# Patient Record
Sex: Male | Born: 1975 | Race: Black or African American | Hispanic: No | Marital: Single | Smoking: Never smoker
Health system: Southern US, Community
[De-identification: ages and names within clinical notes are randomized; demographics above are authoritative.]

## PROBLEM LIST (undated history)

## (undated) DIAGNOSIS — I10 Essential (primary) hypertension: Secondary | ICD-10-CM

## (undated) DIAGNOSIS — N200 Calculus of kidney: Secondary | ICD-10-CM

---

## 1999-11-03 ENCOUNTER — Emergency Department (HOSPITAL_COMMUNITY): Admission: EM | Admit: 1999-11-03 | Discharge: 1999-11-03 | Payer: Self-pay | Admitting: Emergency Medicine

## 2000-05-31 ENCOUNTER — Emergency Department (HOSPITAL_COMMUNITY): Admission: EM | Admit: 2000-05-31 | Discharge: 2000-05-31 | Payer: Self-pay | Admitting: Emergency Medicine

## 2000-05-31 ENCOUNTER — Encounter: Payer: Self-pay | Admitting: Emergency Medicine

## 2000-09-08 ENCOUNTER — Emergency Department (HOSPITAL_COMMUNITY): Admission: EM | Admit: 2000-09-08 | Discharge: 2000-09-09 | Payer: Self-pay | Admitting: Emergency Medicine

## 2003-02-01 ENCOUNTER — Emergency Department (HOSPITAL_COMMUNITY): Admission: EM | Admit: 2003-02-01 | Discharge: 2003-02-01 | Payer: Self-pay | Admitting: Emergency Medicine

## 2003-05-03 ENCOUNTER — Emergency Department (HOSPITAL_COMMUNITY): Admission: EM | Admit: 2003-05-03 | Discharge: 2003-05-04 | Payer: Self-pay | Admitting: Emergency Medicine

## 2006-02-03 ENCOUNTER — Emergency Department (HOSPITAL_COMMUNITY): Admission: EM | Admit: 2006-02-03 | Discharge: 2006-02-03 | Payer: Self-pay | Admitting: Family Medicine

## 2006-12-21 ENCOUNTER — Emergency Department (HOSPITAL_COMMUNITY): Admission: EM | Admit: 2006-12-21 | Discharge: 2006-12-21 | Payer: Self-pay | Admitting: Family Medicine

## 2009-02-08 ENCOUNTER — Emergency Department (HOSPITAL_COMMUNITY): Admission: EM | Admit: 2009-02-08 | Discharge: 2009-02-08 | Payer: Self-pay | Admitting: Emergency Medicine

## 2011-01-29 LAB — DIFFERENTIAL
Basophils Absolute: 0 10*3/uL (ref 0.0–0.1)
Basophils Relative: 0 % (ref 0–1)
Eosinophils Absolute: 0 10*3/uL (ref 0.0–0.7)
Eosinophils Relative: 1 % (ref 0–5)
Lymphocytes Relative: 4 % — ABNORMAL LOW (ref 12–46)
Lymphs Abs: 0.3 10*3/uL — ABNORMAL LOW (ref 0.7–4.0)
Monocytes Absolute: 0.3 10*3/uL (ref 0.1–1.0)
Monocytes Relative: 3 % (ref 3–12)
Neutro Abs: 6.9 10*3/uL (ref 1.7–7.7)
Neutrophils Relative %: 92 % — ABNORMAL HIGH (ref 43–77)

## 2011-01-29 LAB — CBC
HCT: 48.4 % (ref 39.0–52.0)
Hemoglobin: 16.5 g/dL (ref 13.0–17.0)
MCHC: 34 g/dL (ref 30.0–36.0)
MCV: 81.9 fL (ref 78.0–100.0)
Platelets: 241 10*3/uL (ref 150–400)
RBC: 5.91 MIL/uL — ABNORMAL HIGH (ref 4.22–5.81)
RDW: 13 % (ref 11.5–15.5)
WBC: 7.5 10*3/uL (ref 4.0–10.5)

## 2011-01-29 LAB — URINE MICROSCOPIC-ADD ON

## 2011-01-29 LAB — URINALYSIS, ROUTINE W REFLEX MICROSCOPIC
Bilirubin Urine: NEGATIVE
Glucose, UA: NEGATIVE mg/dL
Hgb urine dipstick: NEGATIVE
Ketones, ur: 40 mg/dL — AB
Leukocytes, UA: NEGATIVE
Nitrite: NEGATIVE
Protein, ur: 30 mg/dL — AB
Specific Gravity, Urine: 1.029 (ref 1.005–1.030)
Urobilinogen, UA: 0.2 mg/dL (ref 0.0–1.0)
pH: 6.5 (ref 5.0–8.0)

## 2011-01-29 LAB — POCT I-STAT, CHEM 8
Calcium, Ion: 1.1 mmol/L — ABNORMAL LOW (ref 1.12–1.32)
HCT: 52 % (ref 39.0–52.0)
Hemoglobin: 17.7 g/dL — ABNORMAL HIGH (ref 13.0–17.0)
TCO2: 23 mmol/L (ref 0–100)

## 2013-04-07 ENCOUNTER — Encounter (HOSPITAL_COMMUNITY): Payer: Self-pay | Admitting: Emergency Medicine

## 2013-04-07 ENCOUNTER — Emergency Department (INDEPENDENT_AMBULATORY_CARE_PROVIDER_SITE_OTHER)
Admission: EM | Admit: 2013-04-07 | Discharge: 2013-04-07 | Disposition: A | Discharge status: Home / Self Care | Payer: Self-pay | Source: Home / Self Care | Attending: Family Medicine | Admitting: Family Medicine

## 2013-04-07 DIAGNOSIS — L259 Unspecified contact dermatitis, unspecified cause: Secondary | ICD-10-CM

## 2013-04-07 DIAGNOSIS — L309 Dermatitis, unspecified: Secondary | ICD-10-CM

## 2013-04-07 MED ORDER — CEPHALEXIN 500 MG PO CAPS: 500.0000 mg | ORAL_CAPSULE | Freq: Two times a day (BID) | ORAL | Status: DC

## 2013-04-07 MED ORDER — TRIAMCINOLONE ACETONIDE 0.5 % EX OINT: TOPICAL_OINTMENT | Freq: Two times a day (BID) | CUTANEOUS | Status: DC

## 2013-04-07 MED ORDER — HYDROXYZINE HCL 25 MG PO TABS: 25.0000 mg | ORAL_TABLET | Freq: Four times a day (QID) | ORAL | Status: DC

## 2013-04-07 NOTE — ED Notes (Signed)
Pt c/o spider bite on left forearm x 4 days. Started as a small bite mark and began to scab over. Now is red and inflamed with a rash around site. Used Bencinite clay and aloe with gauze and aloe around site. Then rash appeared yesterday. Patient is alert and oriented.

## 2013-04-07 NOTE — ED Provider Notes (Signed)
History     CSN: [id number]  Arrival date & time 04/07/13  1513   First MD Initiated Contact with Patient 04/07/13 1536      Chief Complaint  Patient presents with  . Insect Bite    (Consider location/radiation/quality/duration/timing/severity/associated sxs/prior treatment) HPI Comments: 37 year old male nondiabetic. Here complaining of a pruriginous rash in his right forearm first noted about 4 days. Patient thinks that he may have had a spider bite. He did not see an insect. He noted a small blister initially that has started to scab now. There is been several other blisters around worsen in the last 2 days. He denies any known exposure. State he was working in the yard wearing gloves about 2 weeks ago. He noted a small purulent drainage from a small area one day ago. Denies fever or chills.   History reviewed. No pertinent past medical history.  History reviewed. No pertinent past surgical history.  No family history on file.  History  Substance Use Topics  . Smoking status: Never Smoker   . Smokeless tobacco: Not on file  . Alcohol Use: No      Review of Systems  Constitutional: Negative for fever and chills.  HENT: Negative for sore throat and trouble swallowing.   Respiratory: Negative for shortness of breath.   Skin: Positive for rash.  Neurological: Negative for dizziness and headaches.    Allergies  Review of patient's allergies indicates no known allergies.  Home Medications   Current Outpatient Rx  Name  Route  Sig  Dispense  Refill  . cephALEXin (KEFLEX) 500 MG capsule   Oral   Take 1 capsule (500 mg total) by mouth 2 (two) times daily.   14 capsule   0   . hydrOXYzine (ATARAX/VISTARIL) 25 MG tablet   Oral   Take 1 tablet (25 mg total) by mouth every 6 (six) hours.   12 tablet   0   . triamcinolone ointment (KENALOG) 0.5 %   Topical   Apply topically 2 (two) times daily.   30 g   0     BP 142/89  Pulse 61  Resp 16  SpO2  98%  Physical Exam  Nursing note and vitals reviewed. Constitutional: He is oriented to person, place, and time. He appears well-developed and well-nourished. No distress.  HENT:  Mouth/Throat: Oropharynx is clear and moist. No oropharyngeal exudate.  Cardiovascular: Normal heart sounds.   Pulmonary/Chest: Breath sounds normal.  Lymphadenopathy:    He has no cervical adenopathy.  Neurological: He is alert and oriented to person, place, and time.  Skin: He is not diaphoretic.  Left fore arm: There is a vesicular rash located in volar surface about 1.5 inch above the wrist. There is mild base erythema and few cloudy filled vesicles with peeling in the center. No significant cellulitis surrounding. No striking erythema.    ED Course  Procedures (including critical care time)  Labs Reviewed - No data to display No results found.   1. Dermatitis       MDM  Impress mildly infected contact dermatitis. Prescribed cephalexin, hydroxyzine and triamcinolone ointment. Supportive care and red flags that should prompt his return to medical attention discussed with patient and provided in writing.        Sharin Grave, MD 04/09/13 1546

## 2013-11-29 ENCOUNTER — Encounter (HOSPITAL_COMMUNITY): Payer: Self-pay | Admitting: Emergency Medicine

## 2013-11-29 ENCOUNTER — Emergency Department (HOSPITAL_COMMUNITY)
Admission: EM | Admit: 2013-11-29 | Discharge: 2013-11-29 | Disposition: A | Discharge status: Home / Self Care | Payer: No Typology Code available for payment source | Attending: Emergency Medicine | Admitting: Emergency Medicine

## 2013-11-29 DIAGNOSIS — S336XXA Sprain of sacroiliac joint, initial encounter: Secondary | ICD-10-CM | POA: Insufficient documentation

## 2013-11-29 DIAGNOSIS — S39012A Strain of muscle, fascia and tendon of lower back, initial encounter: Secondary | ICD-10-CM

## 2013-11-29 DIAGNOSIS — Y929 Unspecified place or not applicable: Secondary | ICD-10-CM | POA: Insufficient documentation

## 2013-11-29 DIAGNOSIS — X58XXXA Exposure to other specified factors, initial encounter: Secondary | ICD-10-CM | POA: Insufficient documentation

## 2013-11-29 DIAGNOSIS — Z87442 Personal history of urinary calculi: Secondary | ICD-10-CM | POA: Insufficient documentation

## 2013-11-29 DIAGNOSIS — Y939 Activity, unspecified: Secondary | ICD-10-CM | POA: Insufficient documentation

## 2013-11-29 HISTORY — DX: Calculus of kidney: N20.0

## 2013-11-29 LAB — URINALYSIS, ROUTINE W REFLEX MICROSCOPIC
BILIRUBIN URINE: NEGATIVE
Glucose, UA: NEGATIVE mg/dL
HGB URINE DIPSTICK: NEGATIVE
Ketones, ur: NEGATIVE mg/dL
Leukocytes, UA: NEGATIVE
Nitrite: NEGATIVE
PH: 6 (ref 5.0–8.0)
Protein, ur: NEGATIVE mg/dL
SPECIFIC GRAVITY, URINE: 1.022 (ref 1.005–1.030)
UROBILINOGEN UA: 0.2 mg/dL (ref 0.0–1.0)

## 2013-11-29 MED ORDER — OXYCODONE-ACETAMINOPHEN 5-325 MG PO TABS
2.0000 | ORAL_TABLET | Freq: Once | ORAL | Status: DC
Start: 2013-11-29 — End: 2013-11-29

## 2013-11-29 MED ORDER — METHOCARBAMOL 500 MG PO TABS
500.0000 mg | ORAL_TABLET | Freq: Two times a day (BID) | ORAL | Status: DC
Start: 2013-11-29 — End: 2016-07-02

## 2013-11-29 MED ORDER — DIAZEPAM 5 MG PO TABS
5.0000 mg | ORAL_TABLET | Freq: Once | ORAL | Status: AC
  Administered 2013-11-29: 5 mg via ORAL
  Filled 2013-11-29: qty 1

## 2013-11-29 MED ORDER — MELOXICAM 7.5 MG PO TABS: 15.0000 mg | ORAL_TABLET | Freq: Every day | ORAL | Status: DC

## 2013-11-29 MED ORDER — KETOROLAC TROMETHAMINE 60 MG/2ML IM SOLN
60.0000 mg | Freq: Once | INTRAMUSCULAR | Status: DC
  Filled 2013-11-29: qty 2

## 2013-11-29 NOTE — ED Provider Notes (Signed)
CSN: [id number]     Arrival date & time 11/29/13  2044 History  This chart was scribed for non-physician practitioner, Antony Madura, PA-C,working with Ethelda Chick, MD, by Karle Plumber, ED Scribe.  This patient was seen in room WTR9/WTR9 and the patient's care was started at 9:43 PM.  Chief Complaint  Patient presents with  . Back Pain   The history is provided by the patient. No language interpreter was used.   HPI Comments:  Nathaniel Austin is a 38 y.o. male with a h/o kidney stones, who presents to the Emergency Department complaining of worsening severe aching lower left-sided back pain without radiation that started yesterday. He states this pain is somewhat similar to the pain he experienced with his kidney stone, but the pain does not travel to the groin area like it did before. He reports the pain feels like a pulled muscle. Pt states he laid down for a nap about 5 hours ago and upon waking he had a hard time getting out of the bed. Pt states that movement makes the pain worse. Pt denies taking anything for pain. He denies fever, nausea, vomiting, diarrhea, bowel or bladder incontinence, inability to walk, weakness, dysuria, hematuria, or numbness of the groin.  Past Medical History  Diagnosis Date  . Kidney stone    History reviewed. No pertinent past surgical history. No family history on file. History  Substance Use Topics  . Smoking status: Never Smoker   . Smokeless tobacco: Not on file  . Alcohol Use: No    Review of Systems  Constitutional: Negative for fever.  Gastrointestinal: Negative for nausea, vomiting and diarrhea.  Musculoskeletal: Positive for back pain.  Neurological: Negative for weakness and numbness.  All other systems reviewed and are negative.    Allergies  Review of patient's allergies indicates no known allergies.  Home Medications   Current Outpatient Rx  Name  Route  Sig  Dispense  Refill  . OVER THE COUNTER MEDICATION      Saw palmetto  berries, yellow dock, cayenne herbal supplement.  Congestion prevention.         . meloxicam (MOBIC) 7.5 MG tablet   Oral   Take 2 tablets (15 mg total) by mouth daily.   30 tablet   0   . methocarbamol (ROBAXIN) 500 MG tablet   Oral   Take 1 tablet (500 mg total) by mouth 2 (two) times daily.   20 tablet   0    Triage Vitals: BP 148/94  Pulse 66  Temp(Src) 98 F (36.7 C) (Oral)  Resp 16  Ht 6\' 5"  (1.956 m)  Wt 265 lb (120.203 kg)  BMI 31.42 kg/m2  SpO2 98%  Physical Exam  Nursing note and vitals reviewed. Constitutional: He is oriented to person, place, and time. He appears well-developed and well-nourished. No distress.  HENT:  Head: Normocephalic and atraumatic.  Eyes: Conjunctivae and EOM are normal. No scleral icterus.  Neck: Normal range of motion.  Cardiovascular: Normal rate, regular rhythm and intact distal pulses.   DP and PT pulses 2+ bilaterally.  Pulmonary/Chest: Effort normal. No respiratory distress.  Abdominal: Soft. He exhibits no distension. There is no tenderness.  Musculoskeletal: Normal range of motion.  Patient with decreased range of motion of back secondary to pain. Tenderness to palpation of the left lumbar paraspinal muscles appreciated. No midline tenderness to the thoracic or lumbosacral midline. No bony deformities or step-offs palpated.  Neurological: He is alert and oriented to person,  place, and time.  Patient ambulatory with normal gait. No gross sensory deficits appreciated. Patellar and Achilles reflexes 2+ bilaterally.  Skin: Skin is warm and dry. No rash noted. He is not diaphoretic. No erythema. No pallor.  Psychiatric: He has a normal mood and affect. His behavior is normal.    ED Course  Procedures (including critical care time) DIAGNOSTIC STUDIES: Oxygen Saturation is 98% on RA, normal by my interpretation.   COORDINATION OF CARE: 9:51 PM- Will obtain urine for a urinalysis and give pain medication in ED. Pt states he did  not drive himself here to the ED. Pt verbalizes understanding and agrees to plan.  Medications  ketorolac (TORADOL) injection 60 mg (not administered)  diazepam (VALIUM) tablet 5 mg (5 mg Oral Given 11/29/13 2207)    Labs Review Labs Reviewed  URINALYSIS, ROUTINE W REFLEX MICROSCOPIC   Imaging Review No results found.  EKG Interpretation   None       MDM   Final diagnoses:  Low back strain   Uncomplicated low-back pain. Patient is on nontoxic appearing, hemodynamically stable, and afebrile. He is ambulatory with normal gait. No gross sensory deficits appreciated. No red flags assigned concerning for cauda equina. Patient endorses improvement with Toradol and Valium in ED. He is stable for discharge with prescription for Mobic and Robaxin. Have advised ice, primary care followup, and that patient refrain from strenuous activity or heavy lifting. Return precautions provided and patient agreeable to plan with no unaddressed concerns.  I personally performed the services described in this documentation, which was scribed in my presence. The recorded information has been reviewed and is accurate.   Filed Vitals:   11/29/13 2120  BP: 148/94  Pulse: 66  Temp: 98 F (36.7 C)  TempSrc: Oral  Resp: 16  Height: 6\' 5"  (1.956 m)  Weight: 265 lb (120.203 kg)  SpO2: 98%     Antony MaduraKelly Reene Harlacher, PA-C 11/29/13 2227

## 2013-11-29 NOTE — ED Notes (Signed)
Pt states onset left lower back pain yesterday, states was sitting down when it started suddenly. Pain progressively worsened today. Pt states hx of kidney stone, unsure if it's that or a pulled muscle.

## 2013-11-29 NOTE — ED Provider Notes (Signed)
Medical screening examination/treatment/procedure(s) were performed by non-physician practitioner and as supervising physician I was immediately available for consultation/collaboration.  EKG Interpretation   None        Ethelda ChickMartha K Linker, MD 11/29/13 2238

## 2013-11-29 NOTE — Discharge Instructions (Signed)
Recommend Mobic and Robaxin for symptoms. Also recommend that you apply ice the affected area 3-4 times per day for at least 30 minutes each time. Complete this for 72 hours after which time you may alternate ice and heat packs. Followup with your primary care provider regarding symptoms. Refrain from strenuous activity or heavy lifting. Return if symptoms worsen.  Back Pain, Adult Low back pain is very common. About 1 in 5 people have back pain.The cause of low back pain is rarely dangerous. The pain often gets better over time.About half of people with a sudden onset of back pain feel better in just 2 weeks. About 8 in 10 people feel better by 6 weeks.  CAUSES Some common causes of back pain include:  Strain of the muscles or ligaments supporting the spine.  Wear and tear (degeneration) of the spinal discs.  Arthritis.  Direct injury to the back. DIAGNOSIS Most of the time, the direct cause of low back pain is not known.However, back pain can be treated effectively even when the exact cause of the pain is unknown.Answering your caregiver's questions about your overall health and symptoms is one of the most accurate ways to make sure the cause of your pain is not dangerous. If your caregiver needs more information, he or she may order lab work or imaging tests (X-rays or MRIs).However, even if imaging tests show changes in your back, this usually does not require surgery. HOME CARE INSTRUCTIONS For many people, back pain returns.Since low back pain is rarely dangerous, it is often a condition that people can learn to [facility] their own.   Remain active. It is stressful on the back to sit or stand in one place. Do not sit, drive, or stand in one place for more than 30 minutes at a time. Take short walks on level surfaces as soon as pain allows.Try to increase the length of time you walk each day.  Do not stay in bed.Resting more than 1 or 2 days can delay your recovery.  Do not avoid  exercise or work.Your body is made to move.It is not dangerous to be active, even though your back may hurt.Your back will likely heal faster if you return to being active before your pain is gone.  Pay attention to your body when you bend and lift. Many people have less discomfortwhen lifting if they bend their knees, keep the load close to their bodies,and avoid twisting. Often, the most comfortable positions are those that put less stress on your recovering back.  Find a comfortable position to sleep. Use a firm mattress and lie on your side with your knees slightly bent. If you lie on your back, put a pillow under your knees.  Only take over-the-counter or prescription medicines as directed by your caregiver. Over-the-counter medicines to reduce pain and inflammation are often the most helpful.Your caregiver may prescribe muscle relaxant drugs.These medicines help dull your pain so you can more quickly return to your normal activities and healthy exercise.  Put ice on the injured area.  Put ice in a plastic bag.  Place a towel between your skin and the bag.  Leave the ice on for 15-20 minutes, 03-04 times a day for the first 2 to 3 days. After that, ice and heat may be alternated to reduce pain and spasms.  Ask your caregiver about trying back exercises and gentle massage. This may be of some benefit.  Avoid feeling anxious or stressed.Stress increases muscle tension and can worsen back  pain.It is important to recognize when you are anxious or stressed and learn ways to manage it.Exercise is a great option. SEEK MEDICAL CARE IF:  You have pain that is not relieved with rest or medicine.  You have pain that does not improve in 1 week.  You have new symptoms.  You are generally not feeling well. SEEK IMMEDIATE MEDICAL CARE IF:   You have pain that radiates from your back into your legs.  You develop new bowel or bladder control problems.  You have unusual weakness or  numbness in your arms or legs.  You develop nausea or vomiting.  You develop abdominal pain.  You feel faint. Document Released: 10/06/2005 Document Revised: 04/06/2012 Document Reviewed: 02/24/2011 [facility] Patient Information 2014 [place], [place].

## 2014-02-02 ENCOUNTER — Emergency Department (INDEPENDENT_AMBULATORY_CARE_PROVIDER_SITE_OTHER): Payer: No Typology Code available for payment source

## 2014-02-02 ENCOUNTER — Emergency Department (HOSPITAL_COMMUNITY)
Admission: EM | Admit: 2014-02-02 | Discharge: 2014-02-02 | Disposition: A | Discharge status: Home / Self Care | Payer: No Typology Code available for payment source | Source: Home / Self Care | Attending: Family Medicine | Admitting: Family Medicine

## 2014-02-02 ENCOUNTER — Encounter (HOSPITAL_COMMUNITY): Payer: Self-pay | Admitting: Emergency Medicine

## 2014-02-02 DIAGNOSIS — IMO0002 Reserved for concepts with insufficient information to code with codable children: Secondary | ICD-10-CM

## 2014-02-02 DIAGNOSIS — Y92009 Unspecified place in unspecified non-institutional (private) residence as the place of occurrence of the external cause: Secondary | ICD-10-CM

## 2014-02-02 DIAGNOSIS — S92919A Unspecified fracture of unspecified toe(s), initial encounter for closed fracture: Secondary | ICD-10-CM

## 2014-02-02 NOTE — ED Provider Notes (Signed)
Nathaniel OaksDevon V Austin is a 38 y.o. male who presents to Urgent Care today for right fifth toe injury. Patient dropped a heavy vacuum cleaner on his bare foot. This resulted in pain present and swelling of the right fifth toe. The pain is moderate and worse with activity. He denies any radiating pain weakness or numbness. He feels well otherwise.   Past Medical History  Diagnosis Date  . Kidney stone    History  Substance Use Topics  . Smoking status: Never Smoker   . Smokeless tobacco: Not on file  . Alcohol Use: No   ROS as above Medications: No current facility-administered medications for this encounter.   Current Outpatient Prescriptions  Medication Sig Dispense Refill  . meloxicam (MOBIC) 7.5 MG tablet Take 2 tablets (15 mg total) by mouth daily.  30 tablet  0  . methocarbamol (ROBAXIN) 500 MG tablet Take 1 tablet (500 mg total) by mouth 2 (two) times daily.  20 tablet  0  . OVER THE COUNTER MEDICATION Saw palmetto berries, yellow dock, cayenne herbal supplement.  Congestion prevention.        Exam:  BP 149/93  Pulse 71  Temp(Src) 98.4 F (36.9 C) (Oral)  Resp 16  SpO2 97% Gen: Well NAD Right foot : Ecchymosis tenderness and swelling present at the proximal end of the right fifth toe. Refill sensation are intact distally   No results found for this or any previous visit (from the past 24 hour(s)). Dg Foot Complete Right  02/02/2014   CLINICAL DATA:  Injury, pain  EXAM: RIGHT FOOT COMPLETE - 3+ VIEW  COMPARISON:  None.  FINDINGS: On the oblique view, the left fifth digit demonstrates a subtle nondisplaced fracture of the distal phalanx. Mild soft tissue swelling. No malalignment. Preserved joint spaces. No significant arthropathy.  IMPRESSION: Acute nondisplaced fracture left fifth toe distal phalanx   Electronically Signed   By: Ruel Favorsrevor  Shick M.D.   On: 02/02/2014 16:11    Assessment and Plan: 38 y.o. male with left fifth toe fracture. Plan to treat with buddy tape, and  postoperative shoe. Followup as needed.  Discussed warning signs or symptoms. Please see discharge instructions. Patient expresses understanding.    Rodolph BongEvan S Starkisha Tullis, MD 02/02/14 [phone]

## 2014-02-02 NOTE — Discharge Instructions (Signed)
Thank you for coming in today. Buddy tape the toes for 2-4 weeks.  Use the postoperative shoe as needed for comfort Come back as needed Toe Fracture Your caregiver has diagnosed you as having a fractured toe. A toe fracture is a break in the bone of a toe. "Buddy taping" is a way of splinting your broken toe, by taping the broken toe to the toe next to it. This "buddy taping" will keep the injured toe from moving beyond normal range of motion. Buddy taping also helps the toe heal in a more normal alignment. It may take 6 to 8 weeks for the toe injury to heal. HOME CARE INSTRUCTIONS   Leave your toes taped together for as long as directed by your caregiver or until you see a doctor for a follow-up examination. You can change the tape after bathing. Always use a small piece of gauze or cotton between the toes when taping them together. This will help the skin stay dry and prevent infection.  Apply ice to the injury for 15-20 minutes each hour while awake for the first 2 days. Put the ice in a plastic bag and place a towel between the bag of ice and your skin.  After the first 2 days, apply heat to the injured area. Use heat for the next 2 to 3 days. Place a heating pad on the foot or soak the foot in warm water as directed by your caregiver.  Keep your foot elevated as much as possible to lessen swelling.  Wear sturdy, supportive shoes. The shoes should not pinch the toes or fit tightly against the toes.  Your caregiver may prescribe a rigid shoe if your foot is very swollen.  Your may be given crutches if the pain is too great and it hurts too much to walk.  Only take over-the-counter or prescription medicines for pain, discomfort, or fever as directed by your caregiver.  If your caregiver has given you a follow-up appointment, it is very important to keep that appointment. Not keeping the appointment could result in a chronic or permanent injury, pain, and disability. If there is any problem  keeping the appointment, you must call back to this facility for assistance. SEEK MEDICAL CARE IF:   You have increased pain or swelling, not relieved with medications.  The pain does not get better after 1 week.  Your injured toe is cold when the others are warm. SEEK IMMEDIATE MEDICAL CARE IF:   The toe becomes cold, numb, or white.  The toe becomes hot (inflamed) and red. Document Released: 10/03/2000 Document Revised: 12/29/2011 Document Reviewed: 05/22/2008 [facility] Patient Information 2014 [place]

## 2014-02-02 NOTE — ED Notes (Signed)
C/o right 5th digit pinky toe injury States he was cleaning the house when he dropped the vacuum on his foot

## 2016-06-27 ENCOUNTER — Telehealth (HOSPITAL_COMMUNITY): Payer: Self-pay | Admitting: Emergency Medicine

## 2016-06-27 ENCOUNTER — Ambulatory Visit (HOSPITAL_COMMUNITY)
Admission: EM | Admit: 2016-06-27 | Discharge: 2016-06-27 | Disposition: A | Discharge status: Home / Self Care | Payer: BLUE CROSS/BLUE SHIELD | Attending: Family Medicine | Admitting: Family Medicine

## 2016-06-27 ENCOUNTER — Encounter (HOSPITAL_COMMUNITY): Payer: Self-pay | Admitting: Emergency Medicine

## 2016-06-27 DIAGNOSIS — J00 Acute nasopharyngitis [common cold]: Secondary | ICD-10-CM | POA: Diagnosis not present

## 2016-06-27 MED ORDER — AZITHROMYCIN 250 MG PO TABS: ORAL_TABLET | ORAL | 0 refills | Status: DC

## 2016-06-27 MED ORDER — GUAIFENESIN-DM 100-10 MG/5ML PO SYRP: 5.0000 mL | ORAL_SOLUTION | ORAL | 0 refills | Status: DC | PRN

## 2016-06-27 NOTE — ED Triage Notes (Signed)
Pt here for chest congestion onset 2 weeks associated w/dry cough and a HA  Denies fevers, chills  A&O x4... NAD

## 2016-06-27 NOTE — Telephone Encounter (Signed)
Pt was d/c by frank patrick, PA and was not given z-pack  Called home phone number and LM in re: to pending Rx e-Rx to [facility] ([address])

## 2016-06-27 NOTE — ED Provider Notes (Signed)
CSN: [id number]     Arrival date & time 06/27/16  1834 History   None    Chief Complaint  Patient presents with  . URI   (Consider location/radiation/quality/duration/timing/severity/associated sxs/prior Treatment) HPI 40 y/o male with cough, and chest congestion for over 1 week. No relief from OTC meds. No fever. Some sputum. Non smoker Past Medical History:  Diagnosis Date  . Kidney stone    History reviewed. No pertinent surgical history. History reviewed. No pertinent family history. Social History  Substance Use Topics  . Smoking status: Never Smoker  . Smokeless tobacco: Never Used  . Alcohol use No    Review of Systems  Denies: HEADACHE, NAUSEA, ABDOMINAL PAIN, CHEST PAIN, CONGESTION, DYSURIA, SHORTNESS OF BREATH  Allergies  Review of patient's allergies indicates no known allergies.  Home Medications   Prior to Admission medications   Medication Sig Start Date End Date Taking? Authorizing Provider  azithromycin (ZITHROMAX) 250 MG tablet Take first 2 tablets together, then 1 every day until finished. 06/27/16   Tharon AquasFrank C Patrick, PA  guaiFENesin-dextromethorphan (ROBITUSSIN DM) 100-10 MG/5ML syrup Take 5 mLs by mouth every 4 (four) hours as needed for cough. 06/27/16   Tharon AquasFrank C Patrick, PA  meloxicam (MOBIC) 7.5 MG tablet Take 2 tablets (15 mg total) by mouth daily. 11/29/13   Antony MaduraKelly Humes, PA-C  methocarbamol (ROBAXIN) 500 MG tablet Take 1 tablet (500 mg total) by mouth 2 (two) times daily. 11/29/13   Antony MaduraKelly Humes, PA-C  OVER THE COUNTER MEDICATION Saw palmetto berries, yellow dock, cayenne herbal supplement.  Congestion prevention.    Historical Provider, MD   Meds Ordered and Administered this Visit  Medications - No data to display  BP 136/92 (BP Location: Left Arm)   Pulse 62   Temp 98.3 F (36.8 C) (Oral)   Resp 16   SpO2 100%  No data found.   Physical Exam NURSES NOTES AND VITAL SIGNS REVIEWED. CONSTITUTIONAL: Well developed, well nourished, no acute  distress HEENT: normocephalic, atraumatic EYES: Conjunctiva normal NECK:normal ROM, supple, no adenopathy PULMONARY:No respiratory distress, normal effort ABDOMINAL: Soft, ND, NT BS+, No CVAT MUSCULOSKELETAL: Normal ROM of all extremities,  SKIN: warm and dry without rash PSYCHIATRIC: Mood and affect, behavior are normal  Urgent Care Course   Clinical Course    Procedures (including critical care time)  Labs Review Labs Reviewed - No data to display  Imaging Review No results found.   Visual Acuity Review  Right Eye Distance:   Left Eye Distance:   Bilateral Distance:    Right Eye Near:   Left Eye Near:    Bilateral Near:         MDM   1. Nasopharyngitis acute     Patient is reassured that there are no issues that require transfer to higher level of care at this time or additional tests. Patient is advised to continue home symptomatic treatment. Patient is advised that if there are new or worsening symptoms to attend the emergency department, contact primary care provider, or return to UC. Instructions of care provided discharged home in stable condition.    THIS NOTE WAS GENERATED USING A VOICE RECOGNITION SOFTWARE PROGRAM. ALL REASONABLE EFFORTS  WERE MADE TO PROOFREAD THIS DOCUMENT FOR ACCURACY.  I have verbally reviewed the discharge instructions with the patient. A printed AVS was given to the patient.  All questions were answered prior to discharge.      Tharon AquasFrank C Patrick, PA 06/27/16 2057

## 2016-06-27 NOTE — ED Notes (Signed)
D/c by Frank Patrick, PA  

## 2016-07-02 ENCOUNTER — Ambulatory Visit (HOSPITAL_COMMUNITY)
Admission: EM | Admit: 2016-07-02 | Discharge: 2016-07-02 | Disposition: A | Discharge status: Home / Self Care | Payer: BLUE CROSS/BLUE SHIELD | Attending: Family Medicine | Admitting: Family Medicine

## 2016-07-02 ENCOUNTER — Encounter (HOSPITAL_COMMUNITY): Payer: Self-pay | Admitting: Family Medicine

## 2016-07-02 DIAGNOSIS — J329 Chronic sinusitis, unspecified: Secondary | ICD-10-CM

## 2016-07-02 DIAGNOSIS — R509 Fever, unspecified: Secondary | ICD-10-CM | POA: Diagnosis not present

## 2016-07-02 DIAGNOSIS — A499 Bacterial infection, unspecified: Secondary | ICD-10-CM | POA: Diagnosis not present

## 2016-07-02 DIAGNOSIS — B9689 Other specified bacterial agents as the cause of diseases classified elsewhere: Secondary | ICD-10-CM

## 2016-07-02 MED ORDER — HYDROCOD POLST-CPM POLST ER 10-8 MG/5ML PO SUER: 5.0000 mL | Freq: Two times a day (BID) | ORAL | 0 refills | Status: DC | PRN

## 2016-07-02 MED ORDER — AMOXICILLIN-POT CLAVULANATE 875-125 MG PO TABS: 1.0000 | ORAL_TABLET | Freq: Two times a day (BID) | ORAL | 0 refills | Status: AC

## 2016-07-02 NOTE — ED Provider Notes (Signed)
CSN: [id number]     Arrival date & time 07/02/16  1918 History   First MD Initiated Contact with Patient 07/02/16 2123     Chief Complaint  Patient presents with  . Cough  . 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 Symptoms have been intermittent for 1 month. He felt better initially but then got sick again. He was recently seem 6 days ago and was prescribed z-pack. Patient finished the antibiotic with no improvement noted.       Past Medical History:  Diagnosis Date  . Kidney stone    History reviewed. No pertinent surgical history. History reviewed. No pertinent family history. Social History  Substance Use Topics  . Smoking status: Never Smoker  . Smokeless tobacco: Never Used  . Alcohol use No    Review of Systems  Constitutional: Positive for chills and fatigue.       Unsure of fever, but patient felt like he did, felt warm  HENT: Positive for congestion, rhinorrhea and sneezing. Negative for ear pain, sinus pressure and sore throat.   Respiratory: Positive for cough. Negative for shortness of breath.   Cardiovascular: Negative for chest pain and palpitations.  Gastrointestinal: Negative for abdominal pain, diarrhea, nausea and vomiting.  Musculoskeletal: Positive for arthralgias and myalgias.  Skin: Negative for rash.  Neurological: Positive for headaches. Negative for dizziness and weakness.    Allergies  Review of patient's allergies indicates no known allergies.  Home Medications   Prior to Admission medications   Medication Sig Start Date End Date Taking? Authorizing Provider  amoxicillin-clavulanate (AUGMENTIN) 875-125 MG tablet Take 1 tablet by mouth every 12 (twelve) hours. 07/02/16 07/09/16  Lucia Estelle, NP  azithromycin (ZITHROMAX) 250 MG tablet  Take first 2 tablets together, then 1 every day until finished. 06/27/16   Tharon Aquas, PA  chlorpheniramine-HYDROcodone [facility] ER) 10-8 MG/5ML SUER Take 5 mLs by mouth every 12 (twelve) hours as needed for cough. 07/02/16   Lucia Estelle, NP  guaiFENesin-dextromethorphan (ROBITUSSIN DM) 100-10 MG/5ML syrup Take 5 mLs by mouth every 4 (four) hours as needed for cough. 06/27/16   Tharon Aquas, PA  OVER THE COUNTER MEDICATION Saw palmetto berries, yellow dock, cayenne herbal supplement.  Congestion prevention.    Historical Provider, MD   Meds Ordered and Administered this Visit  Medications - No data to display  BP (!) 148/106 (BP Location: Left Arm)   Pulse 95   Temp 100.6 F (38.1 C) (Oral)   Resp 18   Ht 6\' 5"  (1.956 m)   Wt 270 lb (122.5 kg)   SpO2 100%   BMI 32.02 kg/m  No data found.   Physical Exam  Constitutional: He is oriented to person, place, and time. He appears well-developed and well-nourished.  HENT:  Head: Normocephalic and atraumatic.  Right Ear: External ear normal.  Left Ear: External ear normal.  Ear canals clear with no erythema. Frontal sinuses tenderness present on palpation.   Eyes: EOM are normal. Pupils are equal, round, and reactive to light.  Neck: Normal range of motion. Neck supple.  Cardiovascular: Normal rate, regular rhythm and normal heart sounds.   Pulmonary/Chest: Effort normal and breath sounds normal. No respiratory distress.  Abdominal: Soft. Bowel sounds are normal. He exhibits no distension.  Lymphadenopathy:    He has no cervical adenopathy.  Neurological: He is alert and oriented  to person, place, and time.  Skin: Skin is warm and dry.  Nursing note and vitals reviewed.   Urgent Care Course   Clinical Course    Procedures (including critical care time)  Labs Review Labs Reviewed - No data to display  Imaging Review No results found.     MDM   1. Fever, unspecified fever cause   2. 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 He has fever of 100.6 in room today. He exhibits frontal sinus tenderness on palpation, physical exam unremarkable otherwise. Will treat empirically today for sinus infection with Augmentin BID x 7 days. RX for Tussionex given for cough. All questions were answered. Discharge instruction given.    Lucia EstelleFeng Betha Shadix, NP 07/02/16 2142

## 2016-07-02 NOTE — ED Triage Notes (Signed)
Pt here for worsening cough, congestion, and fever. sts was seen and treated a week ago and worse.

## 2017-01-04 ENCOUNTER — Ambulatory Visit (HOSPITAL_COMMUNITY)
Admission: EM | Admit: 2017-01-04 | Discharge: 2017-01-04 | Disposition: A | Discharge status: Home / Self Care | Payer: BLUE CROSS/BLUE SHIELD

## 2017-05-03 ENCOUNTER — Ambulatory Visit (HOSPITAL_COMMUNITY)
Admission: EM | Admit: 2017-05-03 | Discharge: 2017-05-03 | Disposition: A | Discharge status: Home / Self Care | Payer: BLUE CROSS/BLUE SHIELD | Attending: Internal Medicine | Admitting: Internal Medicine

## 2017-05-03 ENCOUNTER — Encounter (HOSPITAL_COMMUNITY): Payer: Self-pay | Admitting: Emergency Medicine

## 2017-05-03 DIAGNOSIS — M546 Pain in thoracic spine: Secondary | ICD-10-CM

## 2017-05-03 DIAGNOSIS — R030 Elevated blood-pressure reading, without diagnosis of hypertension: Secondary | ICD-10-CM | POA: Diagnosis not present

## 2017-05-03 DIAGNOSIS — M542 Cervicalgia: Secondary | ICD-10-CM

## 2017-05-03 MED ORDER — CYCLOBENZAPRINE HCL 5 MG PO TABS: ORAL_TABLET | ORAL | 0 refills | Status: DC

## 2017-05-03 MED ORDER — NAPROXEN 500 MG PO TABS: ORAL_TABLET | ORAL | 0 refills | Status: DC
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
## 2017-05-22 ENCOUNTER — Emergency Department (HOSPITAL_COMMUNITY)
Admission: EM | Admit: 2017-05-22 | Discharge: 2017-05-22 | Disposition: A | Discharge status: Home / Self Care | Payer: BLUE CROSS/BLUE SHIELD | Attending: Emergency Medicine | Admitting: Emergency Medicine

## 2017-05-22 ENCOUNTER — Encounter (HOSPITAL_COMMUNITY): Payer: Self-pay | Admitting: *Deleted

## 2017-05-22 ENCOUNTER — Emergency Department (HOSPITAL_COMMUNITY): Payer: BLUE CROSS/BLUE SHIELD

## 2017-05-22 DIAGNOSIS — Y929 Unspecified place or not applicable: Secondary | ICD-10-CM | POA: Diagnosis not present

## 2017-05-22 DIAGNOSIS — M25512 Pain in left shoulder: Secondary | ICD-10-CM | POA: Insufficient documentation

## 2017-05-22 DIAGNOSIS — Z79899 Other long term (current) drug therapy: Secondary | ICD-10-CM | POA: Diagnosis not present

## 2017-05-22 DIAGNOSIS — Y999 Unspecified external cause status: Secondary | ICD-10-CM | POA: Insufficient documentation

## 2017-05-22 DIAGNOSIS — Y9389 Activity, other specified: Secondary | ICD-10-CM | POA: Diagnosis not present

## 2017-05-22 MED ORDER — CYCLOBENZAPRINE HCL 10 MG PO TABS: 10.0000 mg | ORAL_TABLET | Freq: Every evening | ORAL | 0 refills | Status: DC | PRN

## 2017-05-22 MED ORDER — MELOXICAM 7.5 MG PO TABS: 7.5000 mg | ORAL_TABLET | Freq: Every day | ORAL | 0 refills | Status: DC

## 2017-05-22 MED ORDER — HYDROCODONE-ACETAMINOPHEN 5-325 MG PO TABS
1.0000 | ORAL_TABLET | Freq: Once | ORAL | Status: AC
  Administered 2017-05-22: 1 via ORAL
  Filled 2017-05-22: qty 1

## 2017-05-22 NOTE — Discharge Instructions (Signed)
Please read instructions below. Apply ice to your shoulder for 20 minutes at a time. You can take mobic once per day with meals as needed for pain. You can take Flexeril at bedtime as needed for muscle spasm. Plenty of water. Schedule an appointment with your PCP to follow up on your high blood pressure, as well as to follow up on injury if symptoms persist.  Return to the ER for numbness or tingling down her arms or legs, inability to hold your bowels or empty her bladder, severe headache, vomiting, vision changes, or new or concerning symptoms.
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
## 2017-05-22 NOTE — ED Notes (Signed)
Pt verbalized understanding discharge instructions and denies any further needs or questions at this time. VS stable, ambulatory and steady gait.   
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
## 2017-12-03 ENCOUNTER — Other Ambulatory Visit: Payer: Self-pay | Admitting: Neurology

## 2017-12-03 DIAGNOSIS — R9402 Abnormal brain scan: Secondary | ICD-10-CM

## 2017-12-16 ENCOUNTER — Inpatient Hospital Stay
Admission: RE | Admit: 2017-12-16 | Discharge: 2017-12-16 | Disposition: A | Discharge status: Home / Self Care | Payer: BLUE CROSS/BLUE SHIELD | Source: Ambulatory Visit | Attending: Neurology | Admitting: Neurology

## 2017-12-16 ENCOUNTER — Other Ambulatory Visit: Payer: BLUE CROSS/BLUE SHIELD

## 2017-12-25 ENCOUNTER — Other Ambulatory Visit: Payer: BLUE CROSS/BLUE SHIELD

## 2018-02-25 ENCOUNTER — Encounter (HOSPITAL_COMMUNITY): Payer: Self-pay | Admitting: Emergency Medicine

## 2018-02-25 ENCOUNTER — Emergency Department (HOSPITAL_COMMUNITY)
Admission: EM | Admit: 2018-02-25 | Discharge: 2018-02-25 | Disposition: A | Discharge status: Home / Self Care | Payer: BLUE CROSS/BLUE SHIELD | Attending: Emergency Medicine | Admitting: Emergency Medicine

## 2018-02-25 DIAGNOSIS — Y999 Unspecified external cause status: Secondary | ICD-10-CM | POA: Insufficient documentation

## 2018-02-25 DIAGNOSIS — S161XXA Strain of muscle, fascia and tendon at neck level, initial encounter: Secondary | ICD-10-CM

## 2018-02-25 DIAGNOSIS — Y939 Activity, unspecified: Secondary | ICD-10-CM | POA: Insufficient documentation

## 2018-02-25 DIAGNOSIS — Y92410 Unspecified street and highway as the place of occurrence of the external cause: Secondary | ICD-10-CM | POA: Insufficient documentation

## 2018-02-25 DIAGNOSIS — R51 Headache: Secondary | ICD-10-CM | POA: Insufficient documentation

## 2018-02-25 DIAGNOSIS — S39012A Strain of muscle, fascia and tendon of lower back, initial encounter: Secondary | ICD-10-CM

## 2018-02-25 MED ORDER — METHOCARBAMOL 500 MG PO TABS: ORAL_TABLET | ORAL | 0 refills | Status: DC

## 2018-02-25 MED ORDER — IBUPROFEN 800 MG PO TABS
800.0000 mg | ORAL_TABLET | Freq: Once | ORAL | Status: AC
  Administered 2018-02-25: 800 mg via ORAL
  Filled 2018-02-25: qty 1

## 2018-02-25 MED ORDER — IBUPROFEN 800 MG PO TABS: 800.0000 mg | ORAL_TABLET | Freq: Three times a day (TID) | ORAL | 0 refills | Status: DC
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
## 2018-02-25 NOTE — ED Notes (Signed)
Bed: WTR7 Expected date:  Expected time:  Means of arrival:  Comments: 
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
## 2019-12-28 ENCOUNTER — Telehealth: Payer: Self-pay | Admitting: Family Medicine

## 2019-12-28 NOTE — Telephone Encounter (Signed)
Spoke with patient. Due to ongoing symptoms for approximately 2 years, recommend to call [facility]. Patient voices understanding.
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
## 2020-11-19 ENCOUNTER — Other Ambulatory Visit: Payer: Self-pay | Admitting: Internal Medicine

## 2020-11-20 LAB — COMPLETE METABOLIC PANEL WITH GFR
AG Ratio: 1.2 (calc) (ref 1.0–2.5)
ALT: 12 U/L (ref 9–46)
AST: 18 U/L (ref 10–40)
Albumin: 3.9 g/dL (ref 3.6–5.1)
Alkaline phosphatase (APISO): 71 U/L (ref 36–130)
BUN: 9 mg/dL (ref 7–25)
CO2: 22 mmol/L (ref 20–32)
Calcium: 9.5 mg/dL (ref 8.6–10.3)
Chloride: 104 mmol/L (ref 98–110)
Creat: 0.98 mg/dL (ref 0.60–1.35)
GFR, Est African American: 108 mL/min/{1.73_m2} (ref >=60)
GFR, Est Non African American: 93 mL/min/{1.73_m2} (ref >=60)
Globulin: 3.3 g/dL (calc) (ref 1.9–3.7)
Glucose, Bld: 88 mg/dL (ref 65–99)
Potassium: 4.8 mmol/L (ref 3.5–5.3)
Sodium: 138 mmol/L (ref 135–146)
Total Bilirubin: 0.2 mg/dL (ref 0.2–1.2)
Total Protein: 7.2 g/dL (ref 6.1–8.1)

## 2020-11-20 LAB — VITAMIN D 25 HYDROXY (VIT D DEFICIENCY, FRACTURES): Vit D, 25-Hydroxy: 12 ng/mL — ABNORMAL LOW (ref 30–100)

## 2020-11-20 LAB — LIPID PANEL
Cholesterol: 151 mg/dL (ref <200)
HDL: 27 mg/dL — ABNORMAL LOW (ref >=40)
LDL Cholesterol (Calc): 81 mg/dL (calc)
Non-HDL Cholesterol (Calc): 124 mg/dL (calc) (ref <130)
Total CHOL/HDL Ratio: 5.6 (calc) — ABNORMAL HIGH (ref <5.0)
Triglycerides: 355 mg/dL — ABNORMAL HIGH (ref <150)

## 2020-11-20 LAB — CBC
HCT: 44.1 % (ref 38.5–50.0)
Hemoglobin: 14.8 g/dL (ref 13.2–17.1)
MCH: 26.5 pg — ABNORMAL LOW (ref 27.0–33.0)
MCHC: 33.6 g/dL (ref 32.0–36.0)
MCV: 79 fL — ABNORMAL LOW (ref 80.0–100.0)
MPV: 9.7 fL (ref 7.5–12.5)
Platelets: 305 10*3/uL (ref 140–400)
RBC: 5.58 10*6/uL (ref 4.20–5.80)
RDW: 13.8 % (ref 11.0–15.0)
WBC: 7.8 10*3/uL (ref 3.8–10.8)

## 2020-11-20 LAB — TSH: TSH: 1.97 mIU/L (ref 0.40–4.50)

## 2020-11-20 LAB — PSA: PSA: 0.47 ng/mL (ref <=4.0)

## 2021-04-03 ENCOUNTER — Ambulatory Visit: Payer: Self-pay | Admitting: Cardiology

## 2022-10-22 DIAGNOSIS — B349 Viral infection, unspecified: Secondary | ICD-10-CM | POA: Diagnosis not present

## 2022-12-13 ENCOUNTER — Encounter (HOSPITAL_COMMUNITY): Payer: Self-pay

## 2022-12-13 ENCOUNTER — Other Ambulatory Visit: Payer: Self-pay

## 2022-12-13 ENCOUNTER — Emergency Department (HOSPITAL_COMMUNITY)
Admission: EM | Admit: 2022-12-13 | Discharge: 2022-12-13 | Disposition: A | Discharge status: Home / Self Care | Payer: Medicaid Other | Attending: Emergency Medicine | Admitting: Emergency Medicine

## 2022-12-13 ENCOUNTER — Emergency Department (HOSPITAL_COMMUNITY): Payer: Medicaid Other

## 2022-12-13 DIAGNOSIS — R0789 Other chest pain: Secondary | ICD-10-CM | POA: Insufficient documentation

## 2022-12-13 DIAGNOSIS — R079 Chest pain, unspecified: Secondary | ICD-10-CM

## 2022-12-13 LAB — CBC
HCT: 40.1 % (ref 39.0–52.0)
Hemoglobin: 13 g/dL (ref 13.0–17.0)
MCH: 26.4 pg (ref 26.0–34.0)
MCHC: 32.4 g/dL (ref 30.0–36.0)
MCV: 81.3 fL (ref 80.0–100.0)
Platelets: 255 10*3/uL (ref 150–400)
RBC: 4.93 MIL/uL (ref 4.22–5.81)
RDW: 14.6 % (ref 11.5–15.5)
WBC: 7.3 10*3/uL (ref 4.0–10.5)
nRBC: 0 % (ref 0.0–0.2)

## 2022-12-13 LAB — BASIC METABOLIC PANEL
Anion gap: 8 (ref 5–15)
BUN: 6 mg/dL (ref 6–20)
CO2: 27 mmol/L (ref 22–32)
Calcium: 9.2 mg/dL (ref 8.9–10.3)
Chloride: 102 mmol/L (ref 98–111)
Creatinine, Ser: 1.17 mg/dL (ref 0.61–1.24)
GFR, Estimated: >60 mL/min (ref >60)
Glucose, Bld: 104 mg/dL — ABNORMAL HIGH (ref 70–99)
Potassium: 4.1 mmol/L (ref 3.5–5.1)
Sodium: 137 mmol/L (ref 135–145)

## 2022-12-13 LAB — TROPONIN I (HIGH SENSITIVITY)
Troponin I (High Sensitivity): 4 ng/L (ref <18)
Troponin I (High Sensitivity): 4 ng/L (ref <18)

## 2022-12-13 NOTE — ED Triage Notes (Signed)
Pt reports left sided chest pain that radiated down his left arm that started this morning that woke him up from his sleep today around midnight. He denies n/v/d/shob.

## 2022-12-13 NOTE — ED Provider Notes (Signed)
[facility] Provider Note   CSN: [id number] Arrival date & time: 12/13/22  0013     History  Chief Complaint  Patient presents with   Chest Pain    Nathaniel Austin is a 47 y.o. male.  47 year old male who presents the ER today secondary to chest pain.  Patient states he was normally with bed.  He was drifting off to sleep he had acute onset of sharp left/retrosternal chest pain.  He tried get him to move around to see if that helped with the start on his left arm pain and he was worried this could be related to his heart so came here for further evaluation.  Denies any burning in nature.  He denies any nausea, vomiting, dyspnea, diaphoresis or lightheadedness associated with it.  States it was preceded by some palpitations that he had palpitations in the past of unclear etiology.  Has no coronary artery history.  States that they thought it was vitamin levels that made his palpitations worse before.  No lower extremity swelling.  No trauma.  No other obvious inciting factors.   Chest Pain      Home Medications Prior to Admission medications   Medication Sig Start Date End Date Taking? Authorizing Provider  ibuprofen (ADVIL,MOTRIN) 800 MG tablet Take 1 tablet (800 mg total) by mouth 3 (three) times daily. 02/25/18   McDonald, Mia A, PA-C  methocarbamol (ROBAXIN) 500 MG tablet Take 3 tabs every 6 hours for 2-3 days, then decrease to 2 tabs every 6 hours for 1-2 days 02/25/18   McDonald, Mia A, PA-C      Allergies    Patient has no known allergies.    Review of Systems   Review of Systems  Cardiovascular:  Positive for chest pain.    Physical Exam Updated Vital Signs BP (!) 146/106 (BP Location: Right Arm)   Pulse 99   Temp 98.9 F (37.2 C) (Oral)   Resp 16   Ht '6\' 5"'$  (1.956 m)   Wt 115.7 kg   SpO2 100%   BMI 30.24 kg/m  Physical Exam  ED Results / Procedures / Treatments   Labs (all labs ordered are listed, but only abnormal  results are displayed) Labs Reviewed  BASIC METABOLIC PANEL - Abnormal; Notable for the following components:      Result Value   Glucose, Bld 104 (*)    All other components within normal limits  CBC  TROPONIN I (HIGH SENSITIVITY)  TROPONIN I (HIGH SENSITIVITY)    EKG EKG Interpretation  Date/Time:  Saturday December 13 2022 00:03:31 EST Ventricular Rate:  73 PR Interval:  220 QRS Duration: 108 QT Interval:  382 QTC Calculation: 420 R Axis:   -66 Text Interpretation: Sinus rhythm with 1st degree A-V block Left axis deviation Minimal voltage criteria for LVH, may be normal variant ( Cornell product ) Inferior infarct , age undetermined Abnormal ECG No previous ECGs available Confirmed by Merrily Pew [phone]) on 12/13/2022 5:11:35 AM  Radiology DG Chest 2 View  Result Date: 12/13/2022 CLINICAL DATA:  Chest pain and left arm pain EXAM: CHEST - 2 VIEW COMPARISON:  05/22/2017 FINDINGS: Stable cardiomediastinal silhouette. Bibasilar atelectasis/scarring. No pleural effusion or pneumothorax. No acute osseous abnormality. IMPRESSION: No acute cardiopulmonary process. Left basilar atelectasis/scarring. Electronically Signed   By: Placido Sou M.D.   On: 12/13/2022 01:48    Procedures Procedures    Medications Ordered in ED Medications - No data to display  ED Course/ Medical Decision Making/ A&P                             Medical Decision Making  47 year old male who presents the ED with atypical chest pain.  His troponins were negative and EKG reassuring, low suspicion for ACS will follow-up with cardiology related to the palpitations part of it. Low risk for PE, PERC negative. Does not seem GI in nature without burning and the fact that he has left arm discomfort with it as well.  No indication for fast imaging related to that. Has been chest pain and symptom-free during most of his whole ER visit, no indication for hospitalization, consultation or further workup here at  this time.  Final Clinical Impression(s) / ED Diagnoses Final diagnoses:  Nonspecific chest pain    Rx / DC Orders ED Discharge Orders     None         Sarkis Rhines, Corene Cornea, MD 12/13/22 [phone]

## 2022-12-13 NOTE — ED Provider Triage Note (Signed)
Emergency Medicine Provider Triage Evaluation Note  Nathaniel Austin , a 47 y.o. male  was evaluated in triage.  Pt complains of left-sided chest discomfort onset 20 minutes prior to arrival while lying in bed trying to fall asleep.  Constant, not associated with nausea, diaphoresis, shortness of breath.  Pain is not worse with exertion.  Does radiate to left arm.  No history of hypertension, hyperlipidemia, diabetes.  No significant family cardiac history.  Review of Systems  Positive: As above Negative: Above  Physical Exam  There were no vitals taken for this visit. Gen:   Awake, no distress   Resp:  Normal effort  MSK:   Moves extremities without difficulty  Other:    Medical Decision Making  Medically screening exam initiated at 12:27 AM.  Appropriate orders placed.  Donavan Foil was informed that the remainder of the evaluation will be completed by another provider, this initial triage assessment does not replace that evaluation, and the importance of remaining in the ED until their evaluation is complete.     Tacy Learn, PA-C 12/13/22 [phone]

## 2022-12-25 DIAGNOSIS — I10 Essential (primary) hypertension: Secondary | ICD-10-CM | POA: Diagnosis not present

## 2022-12-25 DIAGNOSIS — E785 Hyperlipidemia, unspecified: Secondary | ICD-10-CM | POA: Diagnosis not present

## 2023-01-14 DIAGNOSIS — J020 Streptococcal pharyngitis: Secondary | ICD-10-CM | POA: Diagnosis not present

## 2023-01-14 DIAGNOSIS — J029 Acute pharyngitis, unspecified: Secondary | ICD-10-CM | POA: Diagnosis not present

## 2023-05-13 ENCOUNTER — Other Ambulatory Visit: Payer: Self-pay | Admitting: Internal Medicine

## 2023-05-13 DIAGNOSIS — Z1211 Encounter for screening for malignant neoplasm of colon: Secondary | ICD-10-CM | POA: Diagnosis not present

## 2023-05-13 DIAGNOSIS — Z131 Encounter for screening for diabetes mellitus: Secondary | ICD-10-CM | POA: Diagnosis not present

## 2023-05-13 DIAGNOSIS — Z13220 Encounter for screening for lipoid disorders: Secondary | ICD-10-CM | POA: Diagnosis not present

## 2023-05-13 DIAGNOSIS — Z0000 Encounter for general adult medical examination without abnormal findings: Secondary | ICD-10-CM | POA: Diagnosis not present

## 2023-05-13 DIAGNOSIS — E559 Vitamin D deficiency, unspecified: Secondary | ICD-10-CM | POA: Diagnosis not present

## 2023-05-13 DIAGNOSIS — I10 Essential (primary) hypertension: Secondary | ICD-10-CM | POA: Diagnosis not present

## 2023-05-13 DIAGNOSIS — Z125 Encounter for screening for malignant neoplasm of prostate: Secondary | ICD-10-CM | POA: Diagnosis not present

## 2023-05-13 DIAGNOSIS — N529 Male erectile dysfunction, unspecified: Secondary | ICD-10-CM | POA: Diagnosis not present

## 2023-06-03 DIAGNOSIS — I10 Essential (primary) hypertension: Secondary | ICD-10-CM | POA: Diagnosis not present

## 2023-06-03 DIAGNOSIS — Z1212 Encounter for screening for malignant neoplasm of rectum: Secondary | ICD-10-CM | POA: Diagnosis not present

## 2023-06-03 DIAGNOSIS — Z1211 Encounter for screening for malignant neoplasm of colon: Secondary | ICD-10-CM | POA: Diagnosis not present

## 2023-06-03 DIAGNOSIS — R002 Palpitations: Secondary | ICD-10-CM | POA: Diagnosis not present

## 2023-06-03 DIAGNOSIS — J302 Other seasonal allergic rhinitis: Secondary | ICD-10-CM | POA: Diagnosis not present

## 2023-06-21 ENCOUNTER — Emergency Department (HOSPITAL_BASED_OUTPATIENT_CLINIC_OR_DEPARTMENT_OTHER): Payer: Medicaid Other

## 2023-06-21 ENCOUNTER — Other Ambulatory Visit: Payer: Self-pay

## 2023-06-21 ENCOUNTER — Encounter (HOSPITAL_BASED_OUTPATIENT_CLINIC_OR_DEPARTMENT_OTHER): Payer: Self-pay

## 2023-06-21 ENCOUNTER — Emergency Department (HOSPITAL_BASED_OUTPATIENT_CLINIC_OR_DEPARTMENT_OTHER)
Admission: EM | Admit: 2023-06-21 | Discharge: 2023-06-21 | Disposition: A | Discharge status: Home / Self Care | Payer: Medicaid Other | Attending: Emergency Medicine | Admitting: Emergency Medicine

## 2023-06-21 DIAGNOSIS — I10 Essential (primary) hypertension: Secondary | ICD-10-CM | POA: Insufficient documentation

## 2023-06-21 DIAGNOSIS — R0789 Other chest pain: Secondary | ICD-10-CM | POA: Diagnosis not present

## 2023-06-21 HISTORY — DX: Essential (primary) hypertension: I10

## 2023-06-21 LAB — CBC
HCT: 39.9 % (ref 39.0–52.0)
Hemoglobin: 12.7 g/dL — ABNORMAL LOW (ref 13.0–17.0)
MCH: 25.7 pg — ABNORMAL LOW (ref 26.0–34.0)
MCHC: 31.8 g/dL (ref 30.0–36.0)
MCV: 80.6 fL (ref 80.0–100.0)
Platelets: 249 10*3/uL (ref 150–400)
RBC: 4.95 MIL/uL (ref 4.22–5.81)
RDW: 13.7 % (ref 11.5–15.5)
WBC: 6 10*3/uL (ref 4.0–10.5)
nRBC: 0 % (ref 0.0–0.2)

## 2023-06-21 LAB — BASIC METABOLIC PANEL
Anion gap: 10 (ref 5–15)
BUN: 8 mg/dL (ref 6–20)
CO2: 23 mmol/L (ref 22–32)
Calcium: 9.1 mg/dL (ref 8.9–10.3)
Chloride: 101 mmol/L (ref 98–111)
Creatinine, Ser: 1.07 mg/dL (ref 0.61–1.24)
GFR, Estimated: >60 mL/min (ref >60)
Glucose, Bld: 117 mg/dL — ABNORMAL HIGH (ref 70–99)
Potassium: 4 mmol/L (ref 3.5–5.1)
Sodium: 134 mmol/L — ABNORMAL LOW (ref 135–145)

## 2023-06-21 LAB — HEPATIC FUNCTION PANEL
ALT: 20 U/L (ref 0–44)
AST: 20 U/L (ref 15–41)
Albumin: 3.6 g/dL (ref 3.5–5.0)
Alkaline Phosphatase: 78 U/L (ref 38–126)
Bilirubin, Direct: <0.1 mg/dL (ref 0.0–0.2)
Total Bilirubin: 0.4 mg/dL (ref 0.3–1.2)
Total Protein: 7.6 g/dL (ref 6.5–8.1)

## 2023-06-21 LAB — LIPASE, BLOOD: Lipase: 26 U/L (ref 11–51)

## 2023-06-21 LAB — TROPONIN I (HIGH SENSITIVITY)
Troponin I (High Sensitivity): 4 ng/L (ref <18)
Troponin I (High Sensitivity): 4 ng/L (ref <18)

## 2023-06-21 MED ORDER — METOPROLOL TARTRATE 25 MG PO TABS
25.0000 mg | ORAL_TABLET | Freq: Once | ORAL | Status: AC
  Administered 2023-06-21: 25 mg via ORAL
  Filled 2023-06-21: qty 1

## 2023-06-21 MED ORDER — KETOROLAC TROMETHAMINE 30 MG/ML IJ SOLN
15.0000 mg | Freq: Once | INTRAMUSCULAR | Status: AC
  Administered 2023-06-21: 15 mg via INTRAVENOUS
  Filled 2023-06-21: qty 1

## 2023-06-21 NOTE — ED Notes (Signed)
Patient transported to X-ray 

## 2023-06-21 NOTE — ED Provider Notes (Signed)
[facility] Provider Note   CSN: [id number] Arrival date & time: 06/21/23  2130     History  Chief Complaint  Patient presents with   Hypertension    Nathaniel Austin is a 47 y.o. male.  47 year old male presents ER today for couple different symptoms.  Patient is a history of hypertension did not take his blood pressure medication yesterday and this part of his issue.  He states he has had some polyarthralgias for the last few days in different joints that really do not make a lot of sense to him as he has not really do anything to hurt them.  States he had COVID at some point in the past and he feels like he had a lot of problems since then.  He seen his doctor for them and had a bunch of lupus and other autoimmune test ran which were reportedly negative.  Patient states he is also had workups on his heart and describes what sounds like a cardiac MRI that he was told was negative but had a little bit of atherosclerosis.  Tonight he states he woke up feeling abnormal with some chest pain right sided and center not associate with any shortness of breath so he checked his blood pressure and his blood pressure was 160 systolic so he came here to make sure he was having a heart attack.  At this time patient still has some right shoulder pain and some mild right-sided chest pain.  No shortness of breath, diaphoresis, lightheadedness, nausea or other associated symptoms.  No recent illnesses.  No fever or cough.   Hypertension       Home Medications Prior to Admission medications   Medication Sig Start Date End Date Taking? Authorizing Provider  ibuprofen (ADVIL,MOTRIN) 800 MG tablet Take 1 tablet (800 mg total) by mouth 3 (three) times daily. 02/25/18   McDonald, Mia A, PA-C  methocarbamol (ROBAXIN) 500 MG tablet Take 3 tabs every 6 hours for 2-3 days, then decrease to 2 tabs every 6 hours for 1-2 days 02/25/18   McDonald, Mia A, PA-C      Allergies     Patient has no known allergies.    Review of Systems   Review of Systems  Physical Exam Updated Vital Signs BP (!) 147/101 (BP Location: Right Arm)   Pulse 88   Temp 98.2 F (36.8 C) (Oral)   Resp 18   Ht 6\' 5"  (1.956 m)   Wt 114.3 kg   SpO2 100%   BMI 29.88 kg/m  Physical Exam Vitals and nursing note reviewed.  Constitutional:      Appearance: He is well-developed.  HENT:     Head: Normocephalic and atraumatic.  Eyes:     Pupils: Pupils are equal, round, and reactive to light.  Cardiovascular:     Rate and Rhythm: Normal rate.  Pulmonary:     Effort: Pulmonary effort is normal. No respiratory distress.  Abdominal:     General: There is no distension.  Musculoskeletal:        General: Tenderness (To right anterior shoulder) present. Normal range of motion.     Cervical back: Normal range of motion.  Neurological:     Mental Status: He is alert.     ED Results / Procedures / Treatments   Labs (all labs ordered are listed, but only abnormal results are displayed) Labs Reviewed  BASIC METABOLIC PANEL - Abnormal; Notable for the following components:  Result Value   Sodium 134 (*)    Glucose, Bld 117 (*)    All other components within normal limits  CBC - Abnormal; Notable for the following components:   Hemoglobin 12.7 (*)    MCH 25.7 (*)    All other components within normal limits  LIPASE, BLOOD  HEPATIC FUNCTION PANEL  TROPONIN I (HIGH SENSITIVITY)    EKG None  Radiology No results found.  Procedures Procedures    Medications Ordered in ED Medications  metoprolol tartrate (LOPRESSOR) tablet 25 mg (25 mg Oral Given 06/21/23 0650)  ketorolac (TORADOL) 30 MG/ML injection 15 mg (15 mg Intravenous Given 06/21/23 1610)    ED Course/ Medical Decision Making/ A&P Clinical Course as of 06/21/23 2304  Sun Jun 21, 2023  0745 Received sign out from Dr. Clayborne Dana, presenting with HTN, chest pain/shoulder pain. Hasn't been taking home medicines. Initial  troponin normal. Pending XR and repeat troponin.  [WS]  1013 Delta troponin is negative.  Patient still feels well. Will discharge patient to home. All questions answered. Patient comfortable with plan of discharge. Return precautions discussed with patient and specified on the after visit summary.  [WS]    Clinical Course User Index [WS] Lonell Grandchild, MD                                 Medical Decision Making Amount and/or Complexity of Data Reviewed Labs: ordered. Radiology: ordered.  Risk Prescription drug management.  Suspect his chest symptoms might be from his blood pressure not taking blood pressure medication but he is 46 years old slightly overweight so we will go ahead and check delta troponins.  EKG reassuring.  Will also get liver enzymes to ensure this is not some type of referred pain in his shoulder although he has tenderness there which I think is unlikely.  Will give a dose of his home metoprolol.  He is on something else starts with a elevated not sure what it is so we will hold on that 1. First troponin is negative.  Will reevaluate after second troponin. Care transferred pending second troponin. On reeval and update the patient was sleeping and had improvement in symptoms. BP improved on one dose of metoprolol as well.     Final Clinical Impression(s) / ED Diagnoses Final diagnoses:  None    Rx / DC Orders ED Discharge Orders     None         Elmira Olkowski, Barbara Cower, MD 06/21/23 [phone]

## 2023-06-21 NOTE — ED Triage Notes (Addendum)
Pt arrived POV for concern for HTN 160/103, takes BP meds daily, have not taken today and missed his evening dose last night. Reports woke up and felt "weird, like dull chest pain" Pt reports joint pain for 2-3 days and reports right shoulder pain that started at 11 pm tonight w/o injury, pain worse w/movement, currently has ice pack applied to rt shoulder. Pt reports "kind of SOB", denies n/v. Fatigue for the past few days as well. Pt ambulatory w/steady gait, NAD noted, GCS 15.

## 2023-06-21 NOTE — ED Provider Notes (Signed)
   ED Course / MDM   Clinical Course as of 06/21/23 1015  Sun Jun 21, 2023  0745 Received sign out from Dr. Clayborne Dana, presenting with HTN, chest pain/shoulder pain. Hasn't been taking home medicines. Initial troponin normal. Pending XR and repeat troponin.  [WS]  1013 Delta troponin is negative.  Patient still feels well. Will discharge patient to home. All questions answered. Patient comfortable with plan of discharge. Return precautions discussed with patient and specified on the after visit summary.  [WS]    Clinical Course User Index [WS] Lonell Grandchild, MD   Medical Decision Making Amount and/or Complexity of Data Reviewed Labs: ordered. Radiology: ordered.  Risk Prescription drug management.          Lonell Grandchild, MD 06/21/23 [phone]

## 2023-06-21 NOTE — Discharge Instructions (Addendum)
We evaluated you in the emergency department for your chest pain and joint pains. Your workup was reassuring. Please follow up with your primary care doctor and return for any worsening symptoms. Please be sure to take your blood pressure medications as prescribed.

## 2023-07-07 ENCOUNTER — Ambulatory Visit: Payer: Medicaid Other | Admitting: Cardiology

## 2023-07-07 ENCOUNTER — Encounter: Payer: Self-pay | Admitting: Cardiology

## 2023-07-07 ENCOUNTER — Ambulatory Visit: Payer: Medicaid Other | Attending: Cardiology

## 2023-07-07 ENCOUNTER — Encounter: Payer: Self-pay | Admitting: *Deleted

## 2023-07-07 VITALS — BP 128/85 | HR 78 | Resp 16 | Ht 77.0 in | Wt 269.0 lb

## 2023-07-07 DIAGNOSIS — R072 Precordial pain: Secondary | ICD-10-CM | POA: Diagnosis not present

## 2023-07-07 DIAGNOSIS — I10 Essential (primary) hypertension: Secondary | ICD-10-CM

## 2023-07-07 DIAGNOSIS — R002 Palpitations: Secondary | ICD-10-CM | POA: Diagnosis not present

## 2023-07-07 NOTE — Progress Notes (Unsigned)
Enrolled for [employer] to mail a ZIO XT long term holter monitor to the patients address on file.   Letter with instructions mailed to patient.

## 2023-07-07 NOTE — Progress Notes (Signed)
ID:  Nathaniel Austin, DOB Dec 16, 1975, MRN [id number]  PCP:  [facility]  Cardiologist:  Tessa Lerner, DO, [facility] (established care 07/07/23) Former Cardiology Providers: Dr. Sharyn Lull  REASON FOR CONSULT: Palpitations and hypertension  REQUESTING PHYSICIAN:  Pa, [facility] [address] [place],  [place] [address]  Chief Complaint  Patient presents with   Palpitations   New Patient (Initial Visit)   Chest Pain    HPI  Nathaniel Austin is a 47 y.o. African-American male who presents to the clinic for evaluation of palpitations and chest pain at the request of Pa, [facility]. His past medical history and cardiovascular risk factors include: HTN, asthma.  Patient was referred to the practice for evaluation of palpitations and chest pain.  He is here for second opinion.  Recently saw Dr. Sharyn Lull couple months ago and was started on antihypertensive medications.  Patient states that he was supposed to have " a dye test" but he was never scheduled.  Chest pain: Ongoing for the last 1 year. Left-sided. Few times a month. Achy like sensation. Last for minutes. Nonradiating. No improving or worsening factors. Not brought on by effort related activities. Does not improve with resting. Self-limited. He was given sublingual nitroglycerin tablets which he uses on a as needed basis. He had a coronary CTA in 2022 at [facility] for similar symptoms.  These were the same symptoms he was experiencing back in 2022.  Palpitations/fluttering: Also ongoing for the last 1 year  Occurs randomly. Couple times a week. Self-limited. No near-syncope or syncopal events. No cardiac testing. At times when he checks his blood pressures a pulse is reported to be regular. He experiences generalized tired and fatigued.  No family history of premature coronary disease, sudden cardiac death, or cardiomyopathy.  FUNCTIONAL STATUS: No structured exercise program or daily routine.   CARDIAC  DATABASE: EKG: 07/07/2023: Sinus rhythm, 68 bpm, low voltage in the limb leads, without underlying injury pattern.  Echocardiogram: NA  Stress Testing: NA  Coronary CTA: July 2022 at [facility] health records available in Care Everywhere. Total coronary calcium score 1 1. No significant plaque buildup noted. The CAC score is 1. 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   ALLERGIES: No Known Allergies  MEDICATION LIST PRIOR TO VISIT: Current Meds  Medication Sig   aspirin EC 81 MG tablet Take 1 tablet by mouth daily.   losartan (COZAAR) 50 MG tablet Take 50 mg by mouth daily.   metoprolol succinate (TOPROL-XL) 25 MG 24 hr tablet Take 1 tablet by mouth daily.   nitroGLYCERIN (NITROSTAT) 0.4 MG SL tablet SMARTSIG:Tablet(s) Sublingual As Directed   Vitamin D, Ergocalciferol, (DRISDOL) 1.25 MG (50000 UNIT) CAPS capsule Take 50,000 Units by mouth once a week.     PAST MEDICAL HISTORY: Past Medical History:  Diagnosis Date   Hypertension    Kidney stone     PAST SURGICAL HISTORY: History reviewed. No pertinent surgical history.  Requires no living will all will hold allDate: 07/07/23 Last Office Visit: Visit date not found coronary artery disease removed from all of the recent symptoms, cleared EXTR-year-old reporting problem second  FAMILY HISTORY: The patient family history includes Hypertension in his paternal grandmother.  SOCIAL HISTORY:  The patient  reports that he has never smoked. He has never used smokeless tobacco. He reports that he does not drink alcohol and does not use drugs.  REVIEW OF  SYSTEMS: Review of Systems  Constitutional: Positive for malaise/fatigue.  Cardiovascular:  Positive for chest pain (see HPI) and palpitations (see HPI). Negative for claudication, dyspnea on exertion, irregular heartbeat, leg swelling, near-syncope, orthopnea, paroxysmal nocturnal dyspnea and syncope.   Respiratory:  Negative for shortness of breath.   Hematologic/Lymphatic: Negative for bleeding problem.  Musculoskeletal:  Negative for muscle cramps and myalgias.  Neurological:  Negative for dizziness and light-headedness.    PHYSICAL EXAM:    07/07/2023    9:15 AM 06/21/2023   10:00 AM 06/21/2023    9:00 AM  Vitals with BMI  Height 6\' 5"     Weight 269 lbs    BMI 31.89    Systolic 128 111 540  Diastolic 85 80 91  Pulse 78 67 65    Physical Exam  Constitutional: No distress.  Age appropriate, hemodynamically stable.   Neck: No JVD present.  Cardiovascular: Normal rate, regular rhythm, S1 normal, S2 normal, intact distal pulses and normal pulses. Exam reveals no gallop, no S3 and no S4.  No murmur heard. Pulmonary/Chest: Effort normal and breath sounds normal. No stridor. He has no wheezes. He has no rales.  Abdominal: Soft. Bowel sounds are normal. He exhibits no distension. There is no abdominal tenderness.  Musculoskeletal:        General: No edema.     Cervical back: Neck supple.  Neurological: He is alert and oriented to person, place, and time. He has intact cranial nerves (2-12).  Skin: Skin is warm and moist.    LABORATORY DATA:    Latest Ref Rng & Units 06/21/2023    6:15 AM 05/13/2023    3:00 PM 12/13/2022   12:30 AM  CBC  WBC 4.0 - 10.5 K/uL 6.0  4.8  7.3   Hemoglobin 13.0 - 17.0 g/dL 98.1  19.1  47.8   Hematocrit 39.0 - 52.0 % 39.9  41.6  40.1   Platelets 150 - 400 K/uL 249  279  255        Latest Ref Rng & Units 06/21/2023    6:15 AM 05/13/2023    3:00 PM 12/13/2022   12:30 AM  CMP  Glucose 70 - 99 mg/dL 295  621  308   BUN 6 - 20 mg/dL 8  10  6    Creatinine 0.61 - 1.24 mg/dL 6.57  8.46  9.62   Sodium 135 - 145 mmol/L 134  138  137   Potassium 3.5 - 5.1 mmol/L 4.0  4.5  4.1   Chloride 98 - 111 mmol/L 101  101  102   CO2 22 - 32 mmol/L 23  24  27    Calcium 8.9 - 10.3 mg/dL 9.1  9.6  9.2   Total Protein 6.5 - 8.1 g/dL 7.6  7.7    Total Bilirubin 0.3 -  1.2 mg/dL 0.4  0.3    Alkaline Phos 38 - 126 U/L 78     AST 15 - 41 U/L 20  14    ALT 0 - 44 U/L 20  12      Lab Results  Component Value Date   CHOL 132 05/13/2023   HDL 35 (L) 05/13/2023   LDLCALC 69 05/13/2023   TRIG 228 (H) 05/13/2023   CHOLHDL 3.8 05/13/2023   No components found for: "NTPROBNP" No results for input(s): "PROBNP" in the last 8760 hours. Recent Labs    05/13/23 1500  TSH 1.28    BMP Recent Labs    12/13/22 0030 05/13/23 1500  06/21/23 0615  NA 137 138 134*  K 4.1 4.5 4.0  CL 102 101 101  CO2 27 24 23   GLUCOSE 104* 106* 117*  BUN 6 10 8   CREATININE 1.17 1.11 1.07  CALCIUM 9.2 9.6 9.1  GFRNONAA >60  --  >60    HEMOGLOBIN A1C No results found for: "HGBA1C", "MPG"  External Labs: Collected: 05/13/2023 provided by referring physician. Total cholesterol 132, triglycerides 228, HDL 35, calculated LDL 69, non-HDL 97. Sodium 138, potassium 4.5, chloride 101. AST 14, ALT 12. Hemoglobin 13. TSH 1.28  IMPRESSION:    ICD-10-CM   1. Palpitations  R00.2 EKG 12-Lead    LONG TERM MONITOR (3-14 DAYS)    2. Precordial pain  R07.2 PCV CARDIAC STRESS TEST    PCV ECHOCARDIOGRAM COMPLETE    3. Benign hypertension  I10        RECOMMENDATIONS: [place] FADELEY is a 47 y.o. African-American male whose past medical history and cardiac risk factors include: HTN, asthma.  Palpitations No identifiable reversible cause. Electrolytes, hemoglobin, renal function, and thyroid function are within acceptable limits. Zio patch to evaluate for dysrhythmias specifically PVCs. At times he has noted irregular pulse as alerted by his blood pressure cuff.  Precordial pain Symptoms suggestive of noncardiac discomfort No obstructive disease on his prior coronary CTA at outside facility back in 2022. He is here for second opinion due to recurrent of symptoms -been intermittently using sublingual nitroglycerin tablets. Echo will be ordered to evaluate for structural heart  disease and left ventricular systolic function. Exercise treadmill stress test to evaluate for functional capacity and exercise-induced ischemia Educated on seeking medical attention sooner by going to the closest ER via EMS if the symptoms increase in intensity, frequency, duration, or has typical chest pain as discussed in the office.  Patient verbalized understanding.  Benign hypertension Office blood pressures are well-controlled. Currently being managed by PCP.  Most recent lipid profile independently reviewed, triglycerides are not at goal.  Patient is currently being monitored closely with his PCP.  Reemphasized importance of consuming foods that are low in triglycerides.  He is also on aspirin 81 mg p.o. daily.  No true indication for aspirin long-term.  However until the workup is not complete shared decision was to continue for now.  Data Reviewed: I have independently reviewed external notes provided by the referring provider as part of this office visit.   I have independently reviewed results of EKG, labs, coronary CTA results from Care Everywhere, as part of medical decision making. I have ordered the following tests:  Orders Placed This Encounter  Procedures   PCV CARDIAC STRESS TEST    Standing Status:   Future    Standing Expiration Date:   07/06/2024   LONG TERM MONITOR (3-14 DAYS)    Standing Status:   Future    Order Specific Question:   Where should this test be performed?    Answer:   [facility]    Order Specific Question:   Does the patient have an implanted cardiac device?    Answer:   No    Order Specific Question:   Prescribed days of wear    Answer:   7    Order Specific Question:   Type of enrollment    Answer:   Home Enrollment    Order Specific Question:   Vendor:    Answer:   [employer]   EKG 12-Lead   PCV ECHOCARDIOGRAM COMPLETE    Standing Status:   Future  Standing Expiration Date:   07/06/2024   I have now made medications changes at today's  encounter as noted above.  FINAL MEDICATION LIST END OF ENCOUNTER: No orders of the defined types were placed in this encounter.   Medications Discontinued During This Encounter  Medication Reason   ibuprofen (ADVIL,MOTRIN) 800 MG tablet    methocarbamol (ROBAXIN) 500 MG tablet      Current Outpatient Medications:    aspirin EC 81 MG tablet, Take 1 tablet by mouth daily., Disp: , Rfl:    losartan (COZAAR) 50 MG tablet, Take 50 mg by mouth daily., Disp: , Rfl:    metoprolol succinate (TOPROL-XL) 25 MG 24 hr tablet, Take 1 tablet by mouth daily., Disp: , Rfl:    nitroGLYCERIN (NITROSTAT) 0.4 MG SL tablet, SMARTSIG:Tablet(s) Sublingual As Directed, Disp: , Rfl:    Vitamin D, Ergocalciferol, (DRISDOL) 1.25 MG (50000 UNIT) CAPS capsule, Take 50,000 Units by mouth once a week., Disp: , Rfl:   Orders Placed This Encounter  Procedures   PCV CARDIAC STRESS TEST   LONG TERM MONITOR (3-14 DAYS)   EKG 12-Lead   PCV ECHOCARDIOGRAM COMPLETE    There are no Patient Instructions on file for this visit.   --Continue cardiac medications as reconciled in final medication list. --Return in about 6 weeks (around 08/18/2023) for Reevaluation of, Chest pain, Palpitations. or sooner if needed. --Continue follow-up with your primary care physician regarding the management of your other chronic comorbid conditions.  Patient's questions and concerns were addressed to his satisfaction. He voices understanding of the instructions provided during this encounter.   This note was created using a voice recognition software as a result there may be grammatical errors inadvertently enclosed that do not reflect the nature of this encounter. Every attempt is made to correct such errors.  Tessa Lerner, [place], [facility]  Pager:  [phone] Office: [phone]

## 2023-07-08 DIAGNOSIS — J302 Other seasonal allergic rhinitis: Secondary | ICD-10-CM | POA: Diagnosis not present

## 2023-07-08 DIAGNOSIS — I10 Essential (primary) hypertension: Secondary | ICD-10-CM | POA: Diagnosis not present

## 2023-07-08 DIAGNOSIS — E669 Obesity, unspecified: Secondary | ICD-10-CM | POA: Diagnosis not present

## 2023-07-29 ENCOUNTER — Encounter (HOSPITAL_COMMUNITY): Payer: Self-pay | Admitting: Cardiology

## 2023-08-11 ENCOUNTER — Telehealth (HOSPITAL_COMMUNITY): Payer: Self-pay | Admitting: Cardiology

## 2023-08-11 NOTE — Telephone Encounter (Signed)
Just an FYI. We have made several attempts to contact this patient including sending a letter to schedule or reschedule their echocardiogram. We will be removing the patient from the echo WQ.   MAILED LETTER [facility]  07/29/23 [facility] to schedule @ 10:04/LBW  07/24/23 [facility] to schedule @ 9:49/LBW  07/21/23 [facility] to schedule @ 8:44/LBW        Thank you

## 2023-08-12 ENCOUNTER — Encounter: Payer: Self-pay | Admitting: Cardiology

## 2023-08-21 DIAGNOSIS — I10 Essential (primary) hypertension: Secondary | ICD-10-CM | POA: Diagnosis not present

## 2023-08-21 DIAGNOSIS — E559 Vitamin D deficiency, unspecified: Secondary | ICD-10-CM | POA: Diagnosis not present

## 2023-08-21 DIAGNOSIS — E6689 Other obesity not elsewhere classified: Secondary | ICD-10-CM | POA: Diagnosis not present

## 2023-08-21 DIAGNOSIS — J302 Other seasonal allergic rhinitis: Secondary | ICD-10-CM | POA: Diagnosis not present

## 2024-03-27 ENCOUNTER — Ambulatory Visit (HOSPITAL_COMMUNITY)
Admission: EM | Admit: 2024-03-27 | Discharge: 2024-03-27 | Disposition: A | Discharge status: Home / Self Care | Attending: Physician Assistant | Admitting: Physician Assistant

## 2024-03-27 ENCOUNTER — Encounter (HOSPITAL_COMMUNITY): Payer: Self-pay | Admitting: Emergency Medicine

## 2024-03-27 ENCOUNTER — Ambulatory Visit (INDEPENDENT_AMBULATORY_CARE_PROVIDER_SITE_OTHER)

## 2024-03-27 DIAGNOSIS — M25422 Effusion, left elbow: Secondary | ICD-10-CM | POA: Diagnosis not present

## 2024-03-27 DIAGNOSIS — M7022 Olecranon bursitis, left elbow: Secondary | ICD-10-CM

## 2024-03-27 MED ORDER — NAPROXEN 500 MG PO TABS: 500.0000 mg | ORAL_TABLET | Freq: Two times a day (BID) | ORAL | 0 refills | Status: DC

## 2024-03-27 NOTE — Discharge Instructions (Addendum)
 Wear sling.  Ice to area of swelling.

## 2024-03-27 NOTE — ED Triage Notes (Signed)
 Patient presents with left elbow pain and swelling x 1 week. Patient also is c/o right shoulder discomfort x 1 day.

## 2024-03-28 NOTE — ED Provider Notes (Signed)
 [facility]    CSN: [id number] Arrival date & time: 03/27/24  1500      History   Chief Complaint Chief Complaint  Patient presents with   elbow discomfort    HPI Nathaniel Austin is a 48 y.o. male.   Patient complains of swelling to his left elbow.  He does not recall any injury.  Patient reports that he had a popping sensation in the elbow and a few days later noticed swelling.  Patient does a lot of keyboard work.  He denies any lifting denies any straining.  He does not recall putting increased pressure on his elbows.  Patient denies any medical problems.     Past Medical History:  Diagnosis Date   Hypertension    Kidney stone     There are no active problems to display for this patient.   History reviewed. No pertinent surgical history.     Home Medications    Prior to Admission medications   Medication Sig Start Date End Date Taking? Authorizing Provider  naproxen  (NAPROSYN ) 500 MG tablet Take 1 tablet (500 mg total) by mouth 2 (two) times daily with a meal. 03/27/24 03/27/25 Yes Dorothey Gate K, PA-C  losartan (COZAAR) 50 MG tablet Take 50 mg by mouth daily. 04/08/23   [provider]  nitroGLYCERIN (NITROSTAT) 0.4 MG SL tablet SMARTSIG:Tablet(s) Sublingual As Directed    [provider]  Vitamin D , Ergocalciferol , (DRISDOL) 1.25 MG (50000 UNIT) CAPS capsule Take 50,000 Units by mouth once a week. 06/09/23   [provider]    Family History Family History  Problem Relation Age of Onset   Hypertension Paternal Grandmother     Social History Social History   Tobacco Use   Smoking status: Never   Smokeless tobacco: Never  Vaping Use   Vaping status: Never Used  Substance Use Topics   Alcohol use: No   Drug use: No     Allergies   Patient has no known allergies.   Review of Systems Review of Systems  All other systems reviewed and are negative.    Physical Exam Triage Vital Signs ED Triage Vitals   Encounter Vitals Group     BP 03/27/24 1554 128/83     Systolic BP Percentile --      Diastolic BP Percentile --      Pulse Rate 03/27/24 1554 71     Resp 03/27/24 1554 18     Temp 03/27/24 1554 97.9 F (36.6 C)     Temp Source 03/27/24 1554 Oral     SpO2 03/27/24 1554 97 %     Weight --      Height --      Head Circumference --      Peak Flow --      Pain Score 03/27/24 1552 5     Pain Loc --      Pain Education --      Exclude from Growth Chart --    No data found.  Updated Vital Signs BP 128/83 (BP Location: Left Arm)   Pulse 71   Temp 97.9 F (36.6 C) (Oral)   Resp 18   SpO2 97%   Visual Acuity Right Eye Distance:   Left Eye Distance:   Bilateral Distance:    Right Eye Near:   Left Eye Near:    Bilateral Near:     Physical Exam Vitals and nursing note reviewed.  Constitutional:      Appearance: He is  well-developed.  HENT:     Head: Normocephalic.  Cardiovascular:     Rate and Rhythm: Normal rate.  Pulmonary:     Effort: Pulmonary effort is normal.  Abdominal:     General: There is no distension.  Musculoskeletal:        General: Swelling and tenderness present. Normal range of motion.     Comments: Swollen left elbow bursa, no erythema, no sign of infection, neurovascular neurosensory intact full range of motion  Skin:    General: Skin is warm.  Neurological:     General: No focal deficit present.     Mental Status: He is alert and oriented to person, place, and time.      UC Treatments / Results  Labs (all labs ordered are listed, but only abnormal results are displayed) Labs Reviewed - No data to display  EKG   Radiology DG Elbow Complete Left Result Date: 03/27/2024 CLINICAL DATA:  swelling. EXAM: LEFT ELBOW - COMPLETE 3+ VIEW COMPARISON:  None Available. FINDINGS: No acute fracture or dislocation. No aggressive osseous lesion. No significant elbow joint arthritis. Small to moderate soft tissue swelling superficial to the olecranon,  favoring olecranon bursitis. No radiopaque foreign bodies. Soft tissues are within normal limits. IMPRESSION: 1. No acute osseous abnormality of the left elbow. 2. Small to moderate soft tissue swelling superficial to the olecranon, favoring olecranon bursitis. Electronically Signed   By: Beula Brunswick M.D.   On: 03/27/2024 16:40    Procedures Procedures (including critical care time)  Medications Ordered in UC Medications - No data to display  Initial Impression / Assessment and Plan / UC Course  I have reviewed the triage vital signs and the nursing notes.  Pertinent labs & imaging results that were available during my care of the patient were reviewed by me and considered in my medical decision making (see chart for details).     X-ray shows no acute findings.  Patient counseled on bursitis.  He is given a prescription for Naprosyn  and a sling he is advised to follow-up with orthopedist for recheck if symptoms persist. Final Clinical Impressions(s) / UC Diagnoses   Final diagnoses:  Swelling of left elbow  Olecranon bursitis of left elbow   Discharge Instructions      Wear sling.  Ice to area of swelling.    ED Prescriptions     Medication Sig Dispense Auth. Provider   naproxen  (NAPROSYN ) 500 MG tablet Take 1 tablet (500 mg total) by mouth 2 (two) times daily with a meal. 30 tablet Sandi Crosby, PA-C      PDMP not reviewed this encounter. An After Visit Summary was printed and given to the patient.       Sandi Crosby, PA-C 03/28/24 [phone]

## 2024-05-06 DIAGNOSIS — M7022 Olecranon bursitis, left elbow: Secondary | ICD-10-CM | POA: Diagnosis not present

## 2024-05-09 ENCOUNTER — Encounter (HOSPITAL_COMMUNITY): Payer: Self-pay | Admitting: *Deleted

## 2024-05-09 ENCOUNTER — Other Ambulatory Visit: Payer: Self-pay

## 2024-05-09 ENCOUNTER — Ambulatory Visit (HOSPITAL_COMMUNITY)
Admission: EM | Admit: 2024-05-09 | Discharge: 2024-05-09 | Disposition: A | Discharge status: Home / Self Care | Attending: Family Medicine | Admitting: Family Medicine

## 2024-05-09 DIAGNOSIS — L03211 Cellulitis of face: Secondary | ICD-10-CM

## 2024-05-09 MED ORDER — AMOXICILLIN-POT CLAVULANATE 875-125 MG PO TABS: 1.0000 | ORAL_TABLET | Freq: Two times a day (BID) | ORAL | 0 refills | Status: AC

## 2024-05-09 NOTE — ED Provider Notes (Signed)
 [facility]    CSN: [id number] Arrival date & time: 05/09/24  0806      History   Chief Complaint Chief Complaint  Patient presents with   Facial Swelling    HPI Nathaniel Austin is a 48 y.o. male.   HPI Here for swelling and pain in his nose.  On July 18 he felt like he was may be congested in his nose.  He has not had any rhinorrhea.  No fever or chills.  Then he started noticing some swelling of his nose, mostly on the right and that has increased some.  No cough and no drainage in his throat.  NKDA  He is about to see someone about his left elbow pain and swelling.  Past Medical History:  Diagnosis Date   Hypertension    Kidney stone     There are no active problems to display for this patient.   History reviewed. No pertinent surgical history.     Home Medications    Prior to Admission medications   Medication Sig Start Date End Date Taking? Authorizing Provider  amoxicillin -clavulanate (AUGMENTIN ) 875-125 MG tablet Take 1 tablet by mouth 2 (two) times daily for 7 days. 05/09/24 05/16/24 Yes Vonna Sharlet POUR, MD  losartan (COZAAR) 50 MG tablet Take 50 mg by mouth daily. 04/08/23  Yes [provider]  Vitamin D , Ergocalciferol , (DRISDOL) 1.25 MG (50000 UNIT) CAPS capsule Take 50,000 Units by mouth once a week. 06/09/23  Yes [provider]  nitroGLYCERIN (NITROSTAT) 0.4 MG SL tablet SMARTSIG:Tablet(s) Sublingual As Directed    [provider]    Family History Family History  Problem Relation Age of Onset   Hypertension Paternal Grandmother     Social History Social History   Tobacco Use   Smoking status: Never   Smokeless tobacco: Never  Vaping Use   Vaping status: Never Used  Substance Use Topics   Alcohol use: No   Drug use: No     Allergies   Patient has no known allergies.   Review of Systems Review of Systems   Physical Exam Triage Vital Signs ED Triage Vitals  Encounter Vitals Group      BP 05/09/24 0825 (!) 150/104     Girls Systolic BP Percentile --      Girls Diastolic BP Percentile --      Boys Systolic BP Percentile --      Boys Diastolic BP Percentile --      Pulse Rate 05/09/24 0825 70     Resp 05/09/24 0825 18     Temp 05/09/24 0825 97.8 F (36.6 C)     Temp src --      SpO2 05/09/24 0825 97 %     Weight --      Height --      Head Circumference --      Peak Flow --      Pain Score 05/09/24 0823 4     Pain Loc --      Pain Education --      Exclude from Growth Chart --    No data found.  Updated Vital Signs BP (!) 150/104   Pulse 70   Temp 97.8 F (36.6 C)   Resp 18   SpO2 97%   Visual Acuity Right Eye Distance:   Left Eye Distance:   Bilateral Distance:    Right Eye Near:   Left Eye Near:    Bilateral Near:     Physical  Exam Vitals reviewed.  Constitutional:      General: He is not in acute distress.    Appearance: He is not ill-appearing, toxic-appearing or diaphoretic.  HENT:     Nose:     Comments: There is tenderness and induration of the nose, right side more prominent than the left. No fluctuance, but is tender. I do not see any dc in the nostril.     Mouth/Throat:     Mouth: Mucous membranes are moist.  Eyes:     Extraocular Movements: Extraocular movements intact.     Conjunctiva/sclera: Conjunctivae normal.     Pupils: Pupils are equal, round, and reactive to light.  Cardiovascular:     Rate and Rhythm: Normal rate and regular rhythm.     Heart sounds: No murmur heard. Pulmonary:     Effort: Pulmonary effort is normal.     Breath sounds: Normal breath sounds.  Musculoskeletal:     Cervical back: Neck supple.  Lymphadenopathy:     Cervical: No cervical adenopathy.  Skin:    Coloration: Skin is not jaundiced or pale.  Neurological:     General: No focal deficit present.     Mental Status: He is alert and oriented to person, place, and time.  Psychiatric:        Behavior: Behavior normal.      UC Treatments /  Results  Labs (all labs ordered are listed, but only abnormal results are displayed) Labs Reviewed - No data to display  EKG   Radiology No results found.  Procedures Procedures (including critical care time)  Medications Ordered in UC Medications - No data to display  Initial Impression / Assessment and Plan / UC Course  I have reviewed the triage vital signs and the nursing notes.  Pertinent labs & imaging results that were available during my care of the patient were reviewed by me and considered in my medical decision making (see chart for details).     Augmentin  is sent in for the cellulitis.  He understands to go to the emergency room if he worsens anyway.   Final Clinical Impressions(s) / UC Diagnoses   Final diagnoses:  Facial cellulitis     Discharge Instructions      Take amoxicillin -clavulanate 875 mg--1 tab twice daily with food for 7 days  Do a warm compress to the area 2 or 3 times a day to help circulation in the area and to help healing.  If you do not improve in the next 1 to 2 days, or if you worsen in any way at any point, please go to the emergency room for further evaluation    ED Prescriptions     Medication Sig Dispense Auth. Provider   amoxicillin -clavulanate (AUGMENTIN ) 875-125 MG tablet Take 1 tablet by mouth 2 (two) times daily for 7 days. 14 tablet Danyiel Crespin K, MD      PDMP not reviewed this encounter.   Vonna Sharlet POUR, MD 05/09/24 [phone]

## 2024-05-09 NOTE — ED Triage Notes (Signed)
 PT felt congestion on Friday with feeling sick on SAt. And on SUN had nasal swelling. Pt is also having same elbow pain.

## 2024-05-09 NOTE — Discharge Instructions (Signed)
 Take amoxicillin -clavulanate 875 mg--1 tab twice daily with food for 7 days  Do a warm compress to the area 2 or 3 times a day to help circulation in the area and to help healing.  If you do not improve in the next 1 to 2 days, or if you worsen in any way at any point, please go to the emergency room for further evaluation

## 2024-05-12 ENCOUNTER — Emergency Department (HOSPITAL_COMMUNITY)
Admission: EM | Admit: 2024-05-12 | Discharge: 2024-05-12 | Disposition: A | Discharge status: Home / Self Care | Source: Ambulatory Visit | Attending: Emergency Medicine | Admitting: Emergency Medicine

## 2024-05-12 ENCOUNTER — Encounter (HOSPITAL_COMMUNITY): Payer: Self-pay

## 2024-05-12 ENCOUNTER — Encounter (HOSPITAL_COMMUNITY): Payer: Self-pay | Admitting: *Deleted

## 2024-05-12 ENCOUNTER — Other Ambulatory Visit: Payer: Self-pay

## 2024-05-12 ENCOUNTER — Ambulatory Visit (HOSPITAL_COMMUNITY): Admission: EM | Admit: 2024-05-12 | Discharge: 2024-05-12 | Disposition: A | Discharge status: Home / Self Care

## 2024-05-12 DIAGNOSIS — R519 Headache, unspecified: Secondary | ICD-10-CM | POA: Diagnosis present

## 2024-05-12 DIAGNOSIS — L03211 Cellulitis of face: Secondary | ICD-10-CM

## 2024-05-12 DIAGNOSIS — R1909 Other intra-abdominal and pelvic swelling, mass and lump: Secondary | ICD-10-CM | POA: Insufficient documentation

## 2024-05-12 DIAGNOSIS — J340 Abscess, furuncle and carbuncle of nose: Secondary | ICD-10-CM

## 2024-05-12 MED ORDER — DOXYCYCLINE HYCLATE 100 MG PO CAPS: 100.0000 mg | ORAL_CAPSULE | Freq: Two times a day (BID) | ORAL | 0 refills | Status: AC

## 2024-05-12 MED ORDER — BENZOCAINE 20 % MT AERO
INHALATION_SPRAY | Freq: Once | OROMUCOSAL | Status: AC
  Filled 2024-05-12: qty 57

## 2024-05-12 MED ORDER — LIDOCAINE HCL (PF) 1 % IJ SOLN
5.0000 mL | Freq: Once | INTRAMUSCULAR | Status: AC
  Administered 2024-05-12: 5 mL
  Filled 2024-05-12: qty 30

## 2024-05-12 NOTE — ED Notes (Addendum)
 Patient is being discharged from the Urgent Care and sent to the Emergency Department via POV . Per Rumaldo Ryder, NP, patient is in need of higher level of care due to Facial cellulitis  . Patient is aware and verbalizes understanding of plan of care.  Vitals:   05/12/24 1951  BP: 125/85  Pulse: 98  Resp: 18  Temp: 99.1 F (37.3 C)  SpO2: 98%

## 2024-05-12 NOTE — ED Provider Notes (Signed)
 [facility]    CSN: [id number] Arrival date & time: 05/12/24  1925      History   Chief Complaint Chief Complaint  Patient presents with   Follow-up    HPI Nathaniel Austin is a 48 y.o. male.   Patient presents for follow-up related to facial cellulitis that he was diagnosed with on 7/21.  Patient states that he has been taking the Augmentin  as prescribed and reports relief of pain, but is concerned because it feels like his right nostril is blocked and he has trouble breathing through this.  Patient states that he is also having chunks of blood in purulent drainage from his nose when he cleans it as well.  Denies fever, body aches, and chills.  The history is provided by the patient and medical records.    Past Medical History:  Diagnosis Date   Hypertension    Kidney stone     There are no active problems to display for this patient.   History reviewed. No pertinent surgical history.     Home Medications    Prior to Admission medications   Medication Sig Start Date End Date Taking? Authorizing Provider  amoxicillin -clavulanate (AUGMENTIN ) 875-125 MG tablet Take 1 tablet by mouth 2 (two) times daily for 7 days. 05/09/24 05/16/24 Yes Vonna Sharlet POUR, MD  losartan (COZAAR) 50 MG tablet Take 50 mg by mouth daily. 04/08/23  Yes [provider]  Vitamin D , Ergocalciferol , (DRISDOL) 1.25 MG (50000 UNIT) CAPS capsule Take 50,000 Units by mouth once a week. 06/09/23  Yes [provider]  nitroGLYCERIN (NITROSTAT) 0.4 MG SL tablet SMARTSIG:Tablet(s) Sublingual As Directed    [provider]    Family History Family History  Problem Relation Age of Onset   Hypertension Paternal Grandmother     Social History Social History   Tobacco Use   Smoking status: Never   Smokeless tobacco: Never  Vaping Use   Vaping status: Never Used  Substance Use Topics   Alcohol use: No   Drug use: No     Allergies   Patient has no known  allergies.   Review of Systems Review of Systems  Per HPI  Physical Exam Triage Vital Signs ED Triage Vitals  Encounter Vitals Group     BP 05/12/24 1951 125/85     Girls Systolic BP Percentile --      Girls Diastolic BP Percentile --      Boys Systolic BP Percentile --      Boys Diastolic BP Percentile --      Pulse Rate 05/12/24 1951 98     Resp 05/12/24 1951 18     Temp 05/12/24 1951 99.1 F (37.3 C)     Temp Source 05/12/24 1951 Oral     SpO2 05/12/24 1951 98 %     Weight --      Height --      Head Circumference --      Peak Flow --      Pain Score 05/12/24 1952 3     Pain Loc --      Pain Education --      Exclude from Growth Chart --    No data found.  Updated Vital Signs BP 125/85   Pulse 98   Temp 99.1 F (37.3 C) (Oral)   Resp 18   SpO2 98%   Visual Acuity Right Eye Distance:   Left Eye Distance:   Bilateral Distance:    Right Eye Near:  Left Eye Near:    Bilateral Near:     Physical Exam Vitals and nursing note reviewed.  Constitutional:      General: He is awake. He is not in acute distress.    Appearance: Normal appearance. He is well-developed and well-groomed. He is not ill-appearing.  HENT:     Nose: Nasal tenderness present.  Skin:    Findings: Abscess and erythema present.     Comments: Significant swelling and erythema noted to right side of nose that spreads into right cheek.  Abscess noted inside right nare that is almost blocking nasal passage.  Neurological:     Mental Status: He is alert.  Psychiatric:        Behavior: Behavior is cooperative.      UC Treatments / Results  Labs (all labs ordered are listed, but only abnormal results are displayed) Labs Reviewed - No data to display  EKG   Radiology No results found.  Procedures Procedures (including critical care time)  Medications Ordered in UC Medications - No data to display  Initial Impression / Assessment and Plan / UC Course  I have reviewed the  triage vital signs and the nursing notes.  Pertinent labs & imaging results that were available during my care of the patient were reviewed by me and considered in my medical decision making (see chart for details).     Patient is overall well-appearing.  Vitals are stable.  Exam findings consistent with facial cellulitis and nasal abscess.  Dr. Vonna did come to visualize the patient that she did see them a few days prior reported that the presence of abscess or swelling to the inside nare is much worse than his visit on 7/21.  Dr. Vonna and I both agree that patient would benefit from evaluation and management of this in the emergency department due to failing outpatient antibiotics and risks associated with location.  Patient is understanding and agreeable to plan at this time.  Patient is stable to arrived to the ER via POV.  Final Clinical Impressions(s) / UC Diagnoses   Final diagnoses:  Facial cellulitis  Nasal abscess     Discharge Instructions      Please go to the emergency department for further evaluation and management of your patient cellulitis and nasal abscess.   ED Prescriptions   None    PDMP not reviewed this encounter.   Johnie Flaming A, NP 05/12/24 2017

## 2024-05-12 NOTE — ED Triage Notes (Signed)
 Pt started having congestion and facial swelling on Sunday. Monday he went to an urgent care and was put on amoxicillin . Since then the nose is still swollen and is not getting better.

## 2024-05-12 NOTE — Discharge Instructions (Signed)
 Please go to the emergency department for further evaluation and management of your patient cellulitis and nasal abscess.

## 2024-05-12 NOTE — Discharge Instructions (Addendum)
 Stop taking the Augmentin .  Start taking doxycycline .  You can try warm compress over area of wound.  Please follow-up in urgent care or emergency room in 2 to 3 days for wound check if symptoms are worsening.  Call ENT for follow up and recheck.

## 2024-05-12 NOTE — ED Triage Notes (Addendum)
 Pt was seen and diagnosed with with facial cellulitis 05/09/24. States has been taking med as prescribed, and pain has improved. Pt concerned that he still feels like right nostril is blocked. States getting chunks of blood and mucus when I gently clean my nose, but wondering if he can remove what feels like a giant booger.

## 2024-05-12 NOTE — ED Provider Notes (Signed)
 [place] EMERGENCY DEPARTMENT AT [facility] Provider Note   CSN: [id number] Arrival date & time: 05/12/24  2026     Patient presents with: Facial Pain   Nathaniel Austin is a 48 y.o. male.  With noncontributory past medical history presents to emergency room with right sided abdominal wall swelling and abscess.  He has had congestion and sinus pain for approximately 1 week but started noticing gradual onset swelling of the nare for the past few days.  Denies fever.  Is currently on amoxicillin .  {Add pertinent medical, surgical, social history, OB history to HPI:32947} HPI     Prior to Admission medications   Medication Sig Start Date End Date Taking? Authorizing Provider  amoxicillin -clavulanate (AUGMENTIN ) 875-125 MG tablet Take 1 tablet by mouth 2 (two) times daily for 7 days. 05/09/24 05/16/24  Vonna Sharlet POUR, MD  losartan (COZAAR) 50 MG tablet Take 50 mg by mouth daily. 04/08/23   [provider]  nitroGLYCERIN (NITROSTAT) 0.4 MG SL tablet SMARTSIG:Tablet(s) Sublingual As Directed    [provider]  Vitamin D , Ergocalciferol , (DRISDOL) 1.25 MG (50000 UNIT) CAPS capsule Take 50,000 Units by mouth once a week. 06/09/23   [provider]    Allergies: Patient has no known allergies.    Review of Systems  Skin:  Positive for wound.    Updated Vital Signs BP (!) 147/101 (BP Location: Right Arm)   Pulse 90   Temp 98.2 F (36.8 C) (Oral)   Resp 18   Ht 6' (1.829 m)   Wt 113.4 kg   SpO2 100%   BMI 33.91 kg/m   Physical Exam Vitals and nursing note reviewed.  Constitutional:      General: He is not in acute distress.    Appearance: He is not toxic-appearing.  HENT:     Head: Normocephalic and atraumatic.     Nose:   Eyes:     General: No scleral icterus.    Conjunctiva/sclera: Conjunctivae normal.  Cardiovascular:     Rate and Rhythm: Normal rate and regular rhythm.     Pulses: Normal pulses.     Heart sounds: Normal heart  sounds.  Pulmonary:     Effort: Pulmonary effort is normal. No respiratory distress.     Breath sounds: Normal breath sounds.  Abdominal:     General: Abdomen is flat. Bowel sounds are normal.     Palpations: Abdomen is soft.     Tenderness: There is no abdominal tenderness.  Skin:    General: Skin is warm and dry.     Findings: No lesion.  Neurological:     General: No focal deficit present.     Mental Status: He is alert and oriented to person, place, and time. Mental status is at baseline.     (all labs ordered are listed, but only abnormal results are displayed) Labs Reviewed - No data to display  EKG: None  Radiology: No results found.  {Document cardiac monitor, telemetry assessment procedure when appropriate:32947} .Incision and Drainage  Date/Time: 05/12/2024 10:28 PM  Performed by: Shermon Warren SAILOR, PA-C Authorized by: Shermon Warren SAILOR, PA-C   Consent:    Consent obtained:  Verbal   Consent given by:  Patient   Risks discussed:  Bleeding, incomplete drainage and damage to other organs   Alternatives discussed:  No treatment Universal protocol:    Procedure explained and questions answered to patient or proxy's satisfaction: yes     Relevant documents present and verified: yes  Test results available : yes     Imaging studies available: yes     Required blood products, implants, devices, and special equipment available: yes     Site/side marked: yes     Immediately prior to procedure, a time out was called: yes     Patient identity confirmed:  Verbally with patient Location:    Type:  Abscess   Location:  Head   Head location:  Nose Pre-procedure details:    Skin preparation:  Antiseptic wash Anesthesia:    Anesthesia method:  Local infiltration   Local anesthetic:  Lidocaine  1% w/o epi Procedure type:    Complexity:  Complex Procedure details:    Ultrasound guidance: yes     Incision types:  Stab incision   Incision depth:  Dermal   Drainage:   Bloody and purulent   Drainage amount:  Moderate   Wound treatment:  Wound left open   Packing materials:  None Post-procedure details:    Procedure completion:  Tolerated with difficulty    Medications Ordered in the ED  Benzocaine  (HURRCAINE) 20 % mouth spray ( Mouth/Throat Given by Other 05/12/24 2149)  lidocaine  (PF) (XYLOCAINE ) 1 % injection 5 mL (5 mLs Other Given by Other 05/12/24 2149)      {Click here for ABCD2, HEART and other calculators REFRESH Note before signing:1}                              Medical Decision Making Risk OTC drugs. Prescription drug management.   This patient presents to the ED for concern of abscess, this involves an extensive number of treatment options, and is a complaint that carries with it a high risk of complications and morbidity.  The differential diagnosis includes cellulitis, intranasal abscess    Problem List / ED Course / Critical interventions / Medication management   *** Incision and drainage in ER with some purulent drainage and mild improvement in symptoms.  Will discontinue antibiotic and start doxycycline  for staph coverage.  No sign of systemic illness and hemodynamically stable, well-appearing. Stable for d/c will given ENT follow up.  I have reviewed the patients home medicines and have made adjustments as needed   {Document critical care time when appropriate  Document review of labs and clinical decision tools ie CHADS2VASC2, etc  Document your independent review of radiology images and any outside records  Document your discussion with family members, caretakers and with consultants  Document social determinants of health affecting pt's care  Document your decision making why or why not admission, treatments were needed:32947:::1}   Final diagnoses:  Nasal abscess    ED Discharge Orders          Ordered    doxycycline  (VIBRAMYCIN ) 100 MG capsule  2 times daily        05/12/24 2226

## 2024-05-16 DIAGNOSIS — M7022 Olecranon bursitis, left elbow: Secondary | ICD-10-CM | POA: Diagnosis not present

## 2024-06-28 DIAGNOSIS — M7022 Olecranon bursitis, left elbow: Secondary | ICD-10-CM | POA: Diagnosis not present

## 2024-08-11 DIAGNOSIS — M7022 Olecranon bursitis, left elbow: Secondary | ICD-10-CM | POA: Diagnosis not present

## 2024-08-11 DIAGNOSIS — G8929 Other chronic pain: Secondary | ICD-10-CM | POA: Diagnosis not present

## 2024-08-11 DIAGNOSIS — M25521 Pain in right elbow: Secondary | ICD-10-CM | POA: Diagnosis not present

## 2024-09-21 DIAGNOSIS — M7022 Olecranon bursitis, left elbow: Secondary | ICD-10-CM | POA: Diagnosis not present
